# Patient Record
Sex: Female | Born: 1960 | Race: Black or African American | Hispanic: No | Marital: Single | Smoking: Former smoker
Health system: Southern US, Community
[De-identification: ages and names within clinical notes are randomized; demographics above are authoritative.]

## PROBLEM LIST (undated history)

## (undated) DIAGNOSIS — K429 Umbilical hernia without obstruction or gangrene: Secondary | ICD-10-CM

## (undated) DIAGNOSIS — R000 Tachycardia, unspecified: Secondary | ICD-10-CM

## (undated) DIAGNOSIS — M1990 Unspecified osteoarthritis, unspecified site: Secondary | ICD-10-CM

## (undated) DIAGNOSIS — K219 Gastro-esophageal reflux disease without esophagitis: Secondary | ICD-10-CM

## (undated) DIAGNOSIS — I10 Essential (primary) hypertension: Secondary | ICD-10-CM

## (undated) DIAGNOSIS — F419 Anxiety disorder, unspecified: Secondary | ICD-10-CM

## (undated) DIAGNOSIS — K5090 Crohn's disease, unspecified, without complications: Secondary | ICD-10-CM

## (undated) HISTORY — PX: BACK SURGERY: SHX140

## (undated) HISTORY — PX: COLONOSCOPY: SHX174

---

## 1992-10-31 DIAGNOSIS — M1990 Unspecified osteoarthritis, unspecified site: Secondary | ICD-10-CM

## 1992-10-31 HISTORY — DX: Unspecified osteoarthritis, unspecified site: M19.90

## 2011-05-31 DIAGNOSIS — I10 Essential (primary) hypertension: Secondary | ICD-10-CM | POA: Insufficient documentation

## 2011-11-01 HISTORY — PX: UPPER GI ENDOSCOPY: SHX6162

## 2011-11-29 ENCOUNTER — Emergency Department: Payer: Self-pay | Admitting: Emergency Medicine

## 2011-11-29 LAB — URINALYSIS, COMPLETE
Bacteria: NONE SEEN
Bilirubin,UR: NEGATIVE
Blood: NEGATIVE
Glucose,UR: NEGATIVE mg/dL (ref 0–75)
Ketone: NEGATIVE
Leukocyte Esterase: NEGATIVE
Nitrite: NEGATIVE
Ph: 6 (ref 4.5–8.0)
RBC,UR: NONE SEEN /HPF (ref 0–5)
Specific Gravity: 1.003 (ref 1.003–1.030)
Squamous Epithelial: NONE SEEN
WBC UR: <1 /HPF (ref 0–5)

## 2011-11-29 LAB — COMPREHENSIVE METABOLIC PANEL
Albumin: 3.6 g/dL (ref 3.4–5.0)
Anion Gap: 10 (ref 7–16)
BUN: 11 mg/dL (ref 7–18)
Calcium, Total: 9.1 mg/dL (ref 8.5–10.1)
Chloride: 99 mmol/L (ref 98–107)
EGFR (African American): >60
EGFR (Non-African Amer.): >60
Glucose: 101 mg/dL — ABNORMAL HIGH (ref 65–99)
Potassium: 3.1 mmol/L — ABNORMAL LOW (ref 3.5–5.1)
SGOT(AST): 23 U/L (ref 15–37)
SGPT (ALT): 20 U/L
Total Protein: 8.6 g/dL — ABNORMAL HIGH (ref 6.4–8.2)

## 2011-11-30 LAB — CBC
HCT: 37.4 % (ref 35.0–47.0)
HGB: 12.3 g/dL (ref 12.0–16.0)
MCH: 27.4 pg (ref 26.0–34.0)
MCV: 84 fL (ref 80–100)
Platelet: 229 10*3/uL (ref 150–440)
RBC: 4.48 10*6/uL (ref 3.80–5.20)
WBC: 7.5 10*3/uL (ref 3.6–11.0)

## 2012-06-05 ENCOUNTER — Other Ambulatory Visit: Payer: Self-pay | Admitting: Gastroenterology

## 2012-06-05 DIAGNOSIS — R109 Unspecified abdominal pain: Secondary | ICD-10-CM

## 2012-06-07 ENCOUNTER — Ambulatory Visit
Admission: RE | Admit: 2012-06-07 | Discharge: 2012-06-07 | Disposition: A | Discharge status: Home / Self Care | Payer: Medicare Other | Source: Ambulatory Visit | Attending: Gastroenterology | Admitting: Gastroenterology

## 2012-06-07 DIAGNOSIS — R109 Unspecified abdominal pain: Secondary | ICD-10-CM

## 2012-08-13 ENCOUNTER — Encounter (HOSPITAL_COMMUNITY): Payer: Self-pay | Admitting: *Deleted

## 2012-08-15 ENCOUNTER — Encounter (HOSPITAL_COMMUNITY): Payer: Self-pay | Admitting: Anesthesiology

## 2012-08-15 ENCOUNTER — Encounter (HOSPITAL_COMMUNITY): Payer: Self-pay | Admitting: *Deleted

## 2012-08-15 ENCOUNTER — Ambulatory Visit (HOSPITAL_COMMUNITY)
Admission: RE | Admit: 2012-08-15 | Discharge: 2012-08-15 | Disposition: A | Discharge status: Home / Self Care | Payer: Medicare Other | Source: Ambulatory Visit | Attending: Gastroenterology | Admitting: Gastroenterology

## 2012-08-15 ENCOUNTER — Encounter (HOSPITAL_COMMUNITY)
Admission: RE | Disposition: A | Discharge status: Home / Self Care | Payer: Self-pay | Source: Ambulatory Visit | Attending: Gastroenterology

## 2012-08-15 ENCOUNTER — Ambulatory Visit (HOSPITAL_COMMUNITY): Payer: Medicare Other | Admitting: Anesthesiology

## 2012-08-15 DIAGNOSIS — I10 Essential (primary) hypertension: Secondary | ICD-10-CM | POA: Insufficient documentation

## 2012-08-15 DIAGNOSIS — K5090 Crohn's disease, unspecified, without complications: Secondary | ICD-10-CM | POA: Insufficient documentation

## 2012-08-15 DIAGNOSIS — K2980 Duodenitis without bleeding: Secondary | ICD-10-CM | POA: Insufficient documentation

## 2012-08-15 DIAGNOSIS — K2940 Chronic atrophic gastritis without bleeding: Secondary | ICD-10-CM | POA: Insufficient documentation

## 2012-08-15 DIAGNOSIS — Z79899 Other long term (current) drug therapy: Secondary | ICD-10-CM | POA: Insufficient documentation

## 2012-08-15 DIAGNOSIS — R1011 Right upper quadrant pain: Secondary | ICD-10-CM | POA: Insufficient documentation

## 2012-08-15 DIAGNOSIS — K219 Gastro-esophageal reflux disease without esophagitis: Secondary | ICD-10-CM | POA: Insufficient documentation

## 2012-08-15 HISTORY — DX: Unspecified osteoarthritis, unspecified site: M19.90

## 2012-08-15 HISTORY — DX: Crohn's disease, unspecified, without complications: K50.90

## 2012-08-15 HISTORY — DX: Umbilical hernia without obstruction or gangrene: K42.9

## 2012-08-15 HISTORY — DX: Gastro-esophageal reflux disease without esophagitis: K21.9

## 2012-08-15 HISTORY — DX: Essential (primary) hypertension: I10

## 2012-08-15 HISTORY — DX: Tachycardia, unspecified: R00.0

## 2012-08-15 HISTORY — DX: Anxiety disorder, unspecified: F41.9

## 2012-08-15 SURGERY — ESOPHAGOGASTRODUODENOSCOPY (EGD) WITH PROPOFOL
Anesthesia: Monitor Anesthesia Care

## 2012-08-15 MED ORDER — SODIUM CHLORIDE 0.9 % IV SOLN
INTRAVENOUS | Status: DC
  Administered 2012-08-15: 12:00:00 via INTRAVENOUS

## 2012-08-15 MED ORDER — MIDAZOLAM HCL 5 MG/5ML IJ SOLN
INTRAMUSCULAR | Status: DC | PRN
  Administered 2012-08-15 (×2): 1 mg via INTRAVENOUS

## 2012-08-15 MED ORDER — BUTAMBEN-TETRACAINE-BENZOCAINE 2-2-14 % EX AERO
INHALATION_SPRAY | CUTANEOUS | Status: DC | PRN
  Administered 2012-08-15: 2 via TOPICAL

## 2012-08-15 MED ORDER — FENTANYL CITRATE 0.05 MG/ML IJ SOLN
INTRAMUSCULAR | Status: DC | PRN
  Administered 2012-08-15 (×2): 50 ug via INTRAVENOUS

## 2012-08-15 MED ORDER — PROPOFOL 10 MG/ML IV EMUL
INTRAVENOUS | Status: DC | PRN
  Administered 2012-08-15: 125 ug/kg/min via INTRAVENOUS

## 2012-08-15 SURGICAL SUPPLY — 14 items

## 2012-08-15 NOTE — Preoperative (Addendum)
Beta Blockers   Reason not to administer Beta Blockers:Not Applicable 

## 2012-08-15 NOTE — Anesthesia Preprocedure Evaluation (Addendum)
Anesthesia Evaluation  Patient identified by MRN, date of birth, ID band Patient awake    Reviewed: Allergy & Precautions, H&P , NPO status , Patient's Chart, lab work & pertinent test results  Airway Mallampati: II TM Distance: >3 FB Neck ROM: Full    Dental  (+) Teeth Intact and Dental Advisory Given   Pulmonary neg pulmonary ROS,  breath sounds clear to auscultation  Pulmonary exam normal       Cardiovascular hypertension, Pt. on medications and Pt. on home beta blockers Rhythm:Regular Rate:Normal     Neuro/Psych Anxiety negative neurological ROS     GI/Hepatic Neg liver ROS, GERD-  Medicated,Crohns dz   Endo/Other  negative endocrine ROS  Renal/GU negative Renal ROS  negative genitourinary   Musculoskeletal negative musculoskeletal ROS (+)   Abdominal   Peds  Hematology negative hematology ROS (+)   Anesthesia Other Findings   Reproductive/Obstetrics negative OB ROS                          Anesthesia Physical Anesthesia Plan  ASA: II  Anesthesia Plan: MAC   Post-op Pain Management:    Induction: Intravenous  Airway Management Planned: Nasal Cannula  Additional Equipment:   Intra-op Plan:   Post-operative Plan:   Informed Consent: I have reviewed the patients History and Physical, chart, labs and discussed the procedure including the risks, benefits and alternatives for the proposed anesthesia with the patient or authorized representative who has indicated his/her understanding and acceptance.   Dental advisory given  Plan Discussed with: CRNA  Anesthesia Plan Comments:         Anesthesia Quick Evaluation

## 2012-08-15 NOTE — Op Note (Signed)
[address] [facility] [address] [place], [address]   ENDOSCOPY PROCEDURE REPORT  PATIENT: Denise, Schaefer  MR#: [id number] BIRTHDATE: 1961/03/31 , 51  yrs. old GENDER: Female ENDOSCOPIST: Willis Modena, MD REFERRED BY:  Stephannie Peters. Kizzie Bane, MD PROCEDURE DATE:  08/15/2012 PROCEDURE:  EGD w/ biopsy ASA CLASS:     Class II INDICATIONS:  abdominal pain in the upper right quadrant.   Crohn's disease. MEDICATIONS: MAC sedation, administered by CRNA TOPICAL ANESTHETIC: Cetacaine Spray  DESCRIPTION OF PROCEDURE: After the risks benefits and alternatives of the procedure were thoroughly explained, informed consent was obtained.  The Pentax Gastroscope [id number] endoscope was introduced through the mouth and advanced to the second portion of the duodenum. Without limitations.  The instrument was slowly withdrawn as the mucosa was fully examined.     Findings:  Normal esophagus.  Antrum and body of stomach were a bit edematous and had some superficial erosions, biopsied with cold forceps.  Stomach otherwise normal.  Duodenal bulb and c-loop had some erosions and fibrotic-appearing tissue, biopsied with cold forceps.  Duodenum otherwise normal to the second portion. Retroflexion into cardia was normal.          The scope was then withdrawn from the patient and the procedure completed.  ENDOSCOPIC IMPRESSION:     As above.  Unclear if above findings in duodenum and stomach are peptic in nature or related to her Crohn's disease.  RECOMMENDATIONS:     1.  Watch for potential complications of procedure. 2.  Await biopsy results. 3.  Continue Nexium 40 mg bo bid. 4.  If biopsies are unrevealing, consider HIDA as next step in evaluation of her RUQ pain. 5.  Follow-up with [facility] in 6-8 weeks.  eSigned:  Willis Modena, MD 08/15/2012 12:17 PM   CC:

## 2012-08-15 NOTE — H&P (Signed)
Patient interval history reviewed.  Patient examined again.  There has been no change from documented H/P dated 08/09/12 (scanned into chart from our office) except as documented above.  Assessment:  1.  Crohn's disease. 2.  Right upper quadrant abdominal pain.  Plan:  1.  Endoscopy. 2.  Risks (bleeding, infection, bowel perforation that could require surgery, sedation-related changes in cardiopulmonary systems), benefits (identification and possible treatment of source of symptoms, exclusion of certain causes of symptoms), and alternatives (watchful waiting, radiographic imaging studies, empiric medical treatment) of upper endoscopy (EGD) were explained to patient in detail and patient wishes to proceed.

## 2012-08-15 NOTE — Transfer of Care (Signed)
Immediate Anesthesia Transfer of Care Note  Patient: Denise Schaefer  Procedure(s) Performed: Procedure(s) (LRB) with comments: ESOPHAGOGASTRODUODENOSCOPY (EGD) WITH PROPOFOL (N/A)  Patient Location: PACU and Endoscopy Unit  Anesthesia Type: MAC  Level of Consciousness: awake, alert , oriented and patient cooperative  Airway & Oxygen Therapy: Patient Spontanous Breathing and Patient connected to nasal cannula oxygen  Post-op Assessment: Report given to PACU RN, Post -op Vital signs reviewed and stable and Patient moving all extremities  Post vital signs: Reviewed and stable  Complications: No apparent anesthesia complications

## 2012-08-15 NOTE — Anesthesia Postprocedure Evaluation (Signed)
Anesthesia Post Note  Patient: Denise Schaefer  Procedure(s) Performed: Procedure(s) (LRB): ESOPHAGOGASTRODUODENOSCOPY (EGD) WITH PROPOFOL (N/A)  Anesthesia type: MAC  Patient location: PACU  Post pain: Pain level controlled  Post assessment: Post-op Vital signs reviewed  Last Vitals:  Filed Vitals:   08/15/12 1220  BP: 95/65  Pulse: 64  Temp: 36.6 C  Resp: 19    Post vital signs: Reviewed  Level of consciousness: sedated  Complications: No apparent anesthesia complications

## 2012-09-17 ENCOUNTER — Other Ambulatory Visit (HOSPITAL_COMMUNITY): Payer: Self-pay | Admitting: Gastroenterology

## 2012-09-17 DIAGNOSIS — R1011 Right upper quadrant pain: Secondary | ICD-10-CM

## 2012-09-25 ENCOUNTER — Encounter (HOSPITAL_COMMUNITY): Payer: Medicare Other

## 2012-10-04 ENCOUNTER — Encounter (HOSPITAL_COMMUNITY)
Admission: RE | Admit: 2012-10-04 | Discharge: 2012-10-04 | Disposition: A | Discharge status: Home / Self Care | Payer: Medicare Other | Source: Ambulatory Visit | Attending: Gastroenterology | Admitting: Gastroenterology

## 2012-10-04 DIAGNOSIS — R1011 Right upper quadrant pain: Secondary | ICD-10-CM | POA: Insufficient documentation

## 2012-10-04 MED ORDER — SINCALIDE 5 MCG IJ SOLR
INTRAMUSCULAR | Status: AC
  Administered 2012-10-04: 1.68 ug via INTRAVENOUS
  Filled 2012-10-04: qty 5

## 2012-10-04 MED ORDER — SINCALIDE 5 MCG IJ SOLR
0.0200 ug/kg | Freq: Once | INTRAMUSCULAR | Status: AC
  Administered 2012-10-04: 1.68 ug via INTRAVENOUS

## 2012-10-04 MED ORDER — TECHNETIUM TC 99M MEBROFENIN IV KIT
5.0000 | PACK | Freq: Once | INTRAVENOUS | Status: AC | PRN
  Administered 2012-10-04: 5 via INTRAVENOUS

## 2012-12-19 ENCOUNTER — Ambulatory Visit: Payer: Self-pay | Admitting: Anesthesiology

## 2012-12-19 LAB — CBC WITH DIFFERENTIAL/PLATELET
Basophil #: 0 10*3/uL (ref 0.0–0.1)
Eosinophil #: 0.1 10*3/uL (ref 0.0–0.7)
HCT: 34.4 % — ABNORMAL LOW (ref 35.0–47.0)
HGB: 11.1 g/dL — ABNORMAL LOW (ref 12.0–16.0)
Lymphocyte #: 1.9 10*3/uL (ref 1.0–3.6)
MCH: 25.6 pg — ABNORMAL LOW (ref 26.0–34.0)
MCHC: 32.1 g/dL (ref 32.0–36.0)
Monocyte %: 10.4 %
Neutrophil #: 2.5 10*3/uL (ref 1.4–6.5)
Platelet: 278 10*3/uL (ref 150–440)
RDW: 15.3 % — ABNORMAL HIGH (ref 11.5–14.5)
WBC: 5 10*3/uL (ref 3.6–11.0)

## 2012-12-19 LAB — POTASSIUM: Potassium: 3.4 mmol/L — ABNORMAL LOW (ref 3.5–5.1)

## 2012-12-19 LAB — SEDIMENTATION RATE: Erythrocyte Sed Rate: 43 mm/hr — ABNORMAL HIGH (ref 0–30)

## 2012-12-27 ENCOUNTER — Ambulatory Visit: Payer: Self-pay | Admitting: General Surgery

## 2013-03-14 ENCOUNTER — Other Ambulatory Visit: Payer: Self-pay | Admitting: Gastroenterology

## 2013-03-14 DIAGNOSIS — R197 Diarrhea, unspecified: Secondary | ICD-10-CM

## 2013-03-14 DIAGNOSIS — R109 Unspecified abdominal pain: Secondary | ICD-10-CM

## 2013-03-15 ENCOUNTER — Ambulatory Visit
Admission: RE | Admit: 2013-03-15 | Discharge: 2013-03-15 | Disposition: A | Discharge status: Home / Self Care | Payer: Medicare Other | Source: Ambulatory Visit | Attending: Gastroenterology | Admitting: Gastroenterology

## 2013-03-15 DIAGNOSIS — R197 Diarrhea, unspecified: Secondary | ICD-10-CM

## 2013-03-15 DIAGNOSIS — R109 Unspecified abdominal pain: Secondary | ICD-10-CM

## 2013-03-15 MED ORDER — IOHEXOL 300 MG/ML  SOLN
100.0000 mL | Freq: Once | INTRAMUSCULAR | Status: AC | PRN
  Administered 2013-03-15: 100 mL via INTRAVENOUS

## 2013-03-20 ENCOUNTER — Ambulatory Visit (INDEPENDENT_AMBULATORY_CARE_PROVIDER_SITE_OTHER): Payer: Medicare Other | Admitting: General Surgery

## 2013-03-20 ENCOUNTER — Encounter: Payer: Self-pay | Admitting: General Surgery

## 2013-03-20 VITALS — BP 128/74 | HR 72 | Resp 14 | Ht 69.0 in | Wt 193.0 lb

## 2013-03-20 DIAGNOSIS — K429 Umbilical hernia without obstruction or gangrene: Secondary | ICD-10-CM

## 2013-03-20 HISTORY — PX: UMBILICAL HERNIA REPAIR: SHX196

## 2013-03-20 NOTE — Patient Instructions (Signed)
Patient to return as needed. 

## 2013-03-20 NOTE — Progress Notes (Signed)
Patient ID: Denise Schaefer, female   DOB: 07/07/1961, 52 y.o.   MRN: [id number]  Chief Complaint  Patient presents with  . Follow-up    complaint of knot around umbilical area. Has had hernia surgery on 12/27/12    HPI Denise Schaefer is a 52 y.o. female who presents with a complaint of a knot around her umbilical area. She noticed the knot approximately 1 month ago. She had an umbilical hernia repair done on 12/27/12. She denies any pain associated with the area. She states she has had increasing problems with her crohn's in the last month. She has tried to reframe from straining due to her recent Crohn's issue.  The patient underwent diagnostic laparoscopy and repair of a 1 cm fascial defect at the umbilicus (primary repair) on 12/27/2012. She did well, and at the time of her 01/02/2013 exam was asymptomatic. She was concerned that she felt an area of swelling above the level of the hernia repair. HPI  Past Medical History  Diagnosis Date  . Anxiety   . GERD (gastroesophageal reflux disease)   . Umbilical hernia   . Crohn's disease   . Tachycardia, unspecified     after cervical fusion  . 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 Hypertension age 60    Past Surgical History  Procedure Laterality Date  . Back surgery      cervical fusion  . Colonoscopy    . Upper gi endoscopy  2013    History reviewed. No pertinent family history.  Social History History  Substance Use Topics  . Smoking status: Former Smoker -- 1.50 packs/day for 15 years    Types: Cigarettes    Quit date: 12/01/2011  . Smokeless tobacco: Never Used  . Alcohol Use: No    Allergies  Allergen Reactions  . Penicillins Rash  . Sulfa Antibiotics Hives    Current Outpatient Prescriptions  Medication Sig Dispense Refill  . Biotin 5000 MCG CAPS Take 1 capsule by mouth daily.      . cholecalciferol (VITAMIN D) 1000 UNITS tablet Take 1,000 Units by mouth daily.      Marland Kitchen esomeprazole (NEXIUM) 40 MG capsule Take 40 mg by mouth  2 (two) times daily.      . fluticasone (FLONASE) 50 MCG/ACT nasal spray Place 1 spray into the nose 2 (two) times daily.      . hydrochlorothiazide (HYDRODIURIL) 25 MG tablet Take 25 mg by mouth daily.      . hydrocortisone (ANUSOL-HC) 2.5 % rectal cream Place 1 application rectally 2 (two) times daily.      . mesalamine (PENTASA) 500 MG CR capsule Take 500 mg by mouth 4 (four) times daily.      . potassium chloride SA (K-DUR,KLOR-CON) 20 MEQ tablet Take 1 tablet by mouth daily.      . sucralfate (CARAFATE) 1 G tablet Take 1 tablet by mouth 4 (four) times daily.       No current facility-administered medications for this visit.    Review of Systems Review of Systems  Constitutional: Negative.   Respiratory: Negative.   Cardiovascular: Negative.   Gastrointestinal: Negative.     Blood pressure 128/74, pulse 72, resp. rate 14, height 5\' 9"  (1.753 m), weight 193 lb (87.544 kg).  Physical Exam Physical Exam  Constitutional: She appears well-developed and well-nourished.  Neck: Trachea normal. No mass and no thyromegaly present.  Cardiovascular: Normal rate, regular rhythm, normal heart sounds and normal pulses.   No murmur heard.  Pulmonary/Chest: Effort normal and breath sounds normal.  Abdominal: Soft. Normal appearance and bowel sounds are normal. There is no hepatosplenomegaly. There is no tenderness. No hernia.   There is no evidence of a recurrent hernia. No thickening, skin distortion or edema appreciated. Data Reviewed CT of the abdomen and pelvis dated 03/14/2013 showed increased changes in the terminal ileal area consistent with the patient's known Crohn's disease. No evidence of recurrent umbilical hernia.  Assessment    Crohn's flare, improved. Normal postop umbilical hernia exam.    Plan    The patient will continue followup with her gastroenterologist. She was reassured that no evidence exists for a recurrent hernia.       Earline Mayotte 03/20/2013, 1:55  PM

## 2013-04-13 ENCOUNTER — Emergency Department: Payer: Self-pay | Admitting: Emergency Medicine

## 2013-04-13 LAB — CBC
HGB: 11.8 g/dL — ABNORMAL LOW (ref 12.0–16.0)
MCHC: 33.3 g/dL (ref 32.0–36.0)
Platelet: 305 10*3/uL (ref 150–440)
RDW: 16.5 % — ABNORMAL HIGH (ref 11.5–14.5)
WBC: 7.6 10*3/uL (ref 3.6–11.0)

## 2013-04-13 LAB — URINALYSIS, COMPLETE
Glucose,UR: NEGATIVE mg/dL (ref 0–75)
Hyaline Cast: 1
Nitrite: NEGATIVE
Protein: NEGATIVE
RBC,UR: 2 /HPF (ref 0–5)
Specific Gravity: 1.028 (ref 1.003–1.030)
Squamous Epithelial: 8
WBC UR: 2 /HPF (ref 0–5)

## 2013-06-03 HISTORY — PX: CARPAL TUNNEL RELEASE: SHX101

## 2014-01-29 ENCOUNTER — Emergency Department: Payer: Self-pay | Admitting: Emergency Medicine

## 2014-01-29 ENCOUNTER — Other Ambulatory Visit: Payer: Self-pay | Admitting: General Surgery

## 2014-01-29 LAB — URINALYSIS, COMPLETE
BLOOD: NEGATIVE
Bacteria: NONE SEEN
Bilirubin,UR: NEGATIVE
Glucose,UR: NEGATIVE mg/dL (ref 0–75)
Ketone: NEGATIVE
Leukocyte Esterase: NEGATIVE
Nitrite: NEGATIVE
Ph: 5 (ref 4.5–8.0)
Protein: NEGATIVE
RBC,UR: 1 /HPF (ref 0–5)
SPECIFIC GRAVITY: 1.016 (ref 1.003–1.030)
Squamous Epithelial: 1

## 2014-01-29 LAB — CBC WITH DIFFERENTIAL/PLATELET
BASOS ABS: 0.1 10*3/uL (ref 0.0–0.1)
Basophil %: 1 %
EOS ABS: 0.1 10*3/uL (ref 0.0–0.7)
Eosinophil %: 1.1 %
HCT: 36.7 % (ref 35.0–47.0)
HGB: 11.6 g/dL — ABNORMAL LOW (ref 12.0–16.0)
LYMPHS ABS: 2.5 10*3/uL (ref 1.0–3.6)
Lymphocyte %: 45.3 %
MCH: 25.9 pg — ABNORMAL LOW (ref 26.0–34.0)
MCHC: 31.7 g/dL — AB (ref 32.0–36.0)
MCV: 82 fL (ref 80–100)
MONO ABS: 0.6 x10 3/mm (ref 0.2–0.9)
MONOS PCT: 11.4 %
NEUTROS ABS: 2.3 10*3/uL (ref 1.4–6.5)
Neutrophil %: 41.2 %
Platelet: 228 10*3/uL (ref 150–440)
RBC: 4.48 10*6/uL (ref 3.80–5.20)
RDW: 15.1 % — ABNORMAL HIGH (ref 11.5–14.5)
WBC: 5.6 10*3/uL (ref 3.6–11.0)

## 2014-01-29 LAB — COMPREHENSIVE METABOLIC PANEL
ALK PHOS: 121 U/L — AB
Albumin: 3.3 g/dL — ABNORMAL LOW (ref 3.4–5.0)
Anion Gap: 4 — ABNORMAL LOW (ref 7–16)
BUN: 11 mg/dL (ref 7–18)
Bilirubin,Total: 0.3 mg/dL (ref 0.2–1.0)
CO2: 31 mmol/L (ref 21–32)
Calcium, Total: 8.7 mg/dL (ref 8.5–10.1)
Chloride: 102 mmol/L (ref 98–107)
Creatinine: 0.87 mg/dL (ref 0.60–1.30)
EGFR (Non-African Amer.): >60
Glucose: 90 mg/dL (ref 65–99)
Osmolality: 273 (ref 275–301)
POTASSIUM: 3.2 mmol/L — AB (ref 3.5–5.1)
SGOT(AST): 25 U/L (ref 15–37)
SGPT (ALT): 21 U/L (ref 12–78)
SODIUM: 137 mmol/L (ref 136–145)
TOTAL PROTEIN: 8.2 g/dL (ref 6.4–8.2)

## 2014-01-29 LAB — TROPONIN I: Troponin-I: <0.02 ng/mL

## 2014-01-29 LAB — LIPASE, BLOOD: LIPASE: 160 U/L (ref 73–393)

## 2014-04-21 ENCOUNTER — Ambulatory Visit: Payer: Self-pay | Admitting: Hematology and Oncology

## 2014-04-30 ENCOUNTER — Ambulatory Visit: Payer: Self-pay | Admitting: Hematology and Oncology

## 2014-09-01 ENCOUNTER — Encounter: Payer: Self-pay | Admitting: General Surgery

## 2014-09-23 ENCOUNTER — Encounter: Payer: Self-pay | Admitting: Gastroenterology

## 2014-10-20 ENCOUNTER — Ambulatory Visit: Payer: Self-pay | Admitting: Urgent Care

## 2014-10-20 LAB — URINALYSIS, COMPLETE
BILIRUBIN, UR: NEGATIVE
BLOOD: NEGATIVE
GLUCOSE, UR: NEGATIVE mg/dL (ref 0–75)
Ketone: NEGATIVE
Nitrite: NEGATIVE
Ph: 6 (ref 4.5–8.0)
Protein: NEGATIVE
RBC,UR: 1 /HPF (ref 0–5)
SPECIFIC GRAVITY: 1.012 (ref 1.003–1.030)

## 2014-10-20 LAB — CBC WITH DIFFERENTIAL/PLATELET
Basophil #: 0.1 10*3/uL (ref 0.0–0.1)
Basophil %: 0.9 %
EOS ABS: 0.1 10*3/uL (ref 0.0–0.7)
EOS PCT: 1 %
HCT: 37.9 % (ref 35.0–47.0)
HGB: 12.1 g/dL (ref 12.0–16.0)
Lymphocyte #: 2.7 10*3/uL (ref 1.0–3.6)
Lymphocyte %: 35.9 %
MCH: 27.5 pg (ref 26.0–34.0)
MCHC: 31.8 g/dL — ABNORMAL LOW (ref 32.0–36.0)
MCV: 87 fL (ref 80–100)
Monocyte #: 0.7 x10 3/mm (ref 0.2–0.9)
Monocyte %: 9.6 %
NEUTROS ABS: 3.9 10*3/uL (ref 1.4–6.5)
Neutrophil %: 52.6 %
Platelet: 256 10*3/uL (ref 150–440)
RBC: 4.38 10*6/uL (ref 3.80–5.20)
RDW: 13.7 % (ref 11.5–14.5)
WBC: 7.4 10*3/uL (ref 3.6–11.0)

## 2014-10-20 LAB — COMPREHENSIVE METABOLIC PANEL
ALBUMIN: 3.3 g/dL — AB (ref 3.4–5.0)
ALK PHOS: 109 U/L
Anion Gap: 7 (ref 7–16)
BILIRUBIN TOTAL: 0.3 mg/dL (ref 0.2–1.0)
BUN: 9 mg/dL (ref 7–18)
CHLORIDE: 103 mmol/L (ref 98–107)
Calcium, Total: 9 mg/dL (ref 8.5–10.1)
Co2: 31 mmol/L (ref 21–32)
Creatinine: 0.78 mg/dL (ref 0.60–1.30)
EGFR (African American): >60
EGFR (Non-African Amer.): >60
Glucose: 94 mg/dL (ref 65–99)
Osmolality: 280 (ref 275–301)
Potassium: 2.9 mmol/L — ABNORMAL LOW (ref 3.5–5.1)
SGOT(AST): 29 U/L (ref 15–37)
SGPT (ALT): 23 U/L
Sodium: 141 mmol/L (ref 136–145)
Total Protein: 8.3 g/dL — ABNORMAL HIGH (ref 6.4–8.2)

## 2014-10-20 LAB — LIPASE, BLOOD: LIPASE: 123 U/L (ref 73–393)

## 2014-10-27 ENCOUNTER — Ambulatory Visit: Payer: Self-pay | Admitting: Urgent Care

## 2014-10-27 LAB — COMPREHENSIVE METABOLIC PANEL
ALK PHOS: 107 U/L
Albumin: 3.2 g/dL — ABNORMAL LOW (ref 3.4–5.0)
Anion Gap: 6 — ABNORMAL LOW (ref 7–16)
BILIRUBIN TOTAL: 0.3 mg/dL (ref 0.2–1.0)
BUN: 8 mg/dL (ref 7–18)
CALCIUM: 8.9 mg/dL (ref 8.5–10.1)
CREATININE: 0.88 mg/dL (ref 0.60–1.30)
Chloride: 104 mmol/L (ref 98–107)
Co2: 31 mmol/L (ref 21–32)
EGFR (Non-African Amer.): >60
Glucose: 84 mg/dL (ref 65–99)
Osmolality: 279 (ref 275–301)
Potassium: 3.2 mmol/L — ABNORMAL LOW (ref 3.5–5.1)
SGOT(AST): 21 U/L (ref 15–37)
SGPT (ALT): 21 U/L
SODIUM: 141 mmol/L (ref 136–145)
Total Protein: 8 g/dL (ref 6.4–8.2)

## 2014-10-29 ENCOUNTER — Ambulatory Visit: Payer: Medicare Other | Admitting: Gastroenterology

## 2014-11-06 ENCOUNTER — Other Ambulatory Visit: Payer: Self-pay | Admitting: Gastroenterology

## 2014-11-06 ENCOUNTER — Ambulatory Visit (INDEPENDENT_AMBULATORY_CARE_PROVIDER_SITE_OTHER): Payer: Medicare Other | Admitting: Gastroenterology

## 2014-11-06 ENCOUNTER — Encounter: Payer: Self-pay | Admitting: Gastroenterology

## 2014-11-06 ENCOUNTER — Other Ambulatory Visit: Payer: Self-pay

## 2014-11-06 VITALS — BP 127/92 | HR 76 | Temp 97.8°F | Ht 67.0 in | Wt 199.4 lb

## 2014-11-06 DIAGNOSIS — K50812 Crohn's disease of both small and large intestine with intestinal obstruction: Secondary | ICD-10-CM

## 2014-11-06 DIAGNOSIS — K50919 Crohn's disease, unspecified, with unspecified complications: Secondary | ICD-10-CM

## 2014-11-06 NOTE — Progress Notes (Signed)
ON RECALL LIST  °

## 2014-11-06 NOTE — Progress Notes (Signed)
Subjective:    Patient ID: Denise Schaefer, female    DOB: 11/27/1960, 54 y.o.   MRN: [id number]  Freddy Jaksch, MD  HPI Had Crohn's disease SINCE 1998. COURSE COMPLICATED BY MEDICATION SIDE EFFECTS(REMICADE: HEPATITIS). DID NOT TOLERATE HUMIRA. BRUISING. ON PENTASA (WORKED FOR A WHILE) AND ASACOL(DIDN'T WORK). DOESN'T RECALL 6-MP/AZATHIOPURINE. LAST HUMIRA/REMICADE ( JUL 2015). TRIED ENTOCORT.   BMs: 5-6 (SOFT BLOBS) IN THE AM(NO BLOOD FOR 2 WEEKS WHEN SHE WIPED). SUBJECTIVE CHILLS/FEVER. FEELS WIPED OUT. MAY HAVE A CATCH IN RUQ(BURNING). TCS EVERY YEAR: 2015. MAY HAVE BURNING IN CHEST A.D MID/UPR ABDOMEN. EGD 2 YRS AGO: OFF AN ON SX FOR 2 YRS. NO WEIGHT LOSS-QUIT SMOKING Nov 30, 2010. NO ASPIRIN, BC/GOODY POWDERS, IBUPROFEN/MOTRIN, OR NAPROXEN/ALEVE. NO ETOH. HIP AND JOINT PROBLEMS.    PT DENIES FEVER, CHILLS, nausea, vomiting, melena, diarrhea, CHEST PAIN, SHORTNESS OF BREATH,  constipation, problems swallowing, problems with sedation, OR  heartburn or indigestion. NO SORES IN MOUTH, EYE PAIN,  OR RASH ON LEGS. WATERY EYES.    Past Medical History  Diagnosis Date  . Anxiety   . GERD (gastroesophageal reflux disease)   . Umbilical hernia   . Crohn's disease   . Tachycardia, unspecified     after cervical fusion  . 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 Hypertension age 76   Past Surgical History  Procedure Laterality Date  . Back surgery      cervical fusion  . Colonoscopy    . Upper gi endoscopy  2013   Allergies  Allergen Reactions  . Penicillins Rash  . Sulfa Antibiotics Hives  . Oxycodone Hives  . Tramadol Hives    Itching and hives     Current Outpatient Prescriptions  Medication Sig Dispense Refill  . Biotin 5000 MCG CAPS Take 1 capsule by mouth daily.    . busPIRone (BUSPAR) 7.5 MG tablet Take 7.5 mg by mouth 2 (two) times daily.    . cetirizine (ZYRTEC) 10 MG tablet Take 10 mg by mouth daily.    . cholecalciferol (VITAMIN D) 1000 UNITS tablet Take 1,000 Units by  mouth daily.    Marland Kitchen esomeprazole (NEXIUM) 40 MG capsule Take 40 mg by mouth 2 (two) times daily.    . ferrous sulfate 325 (65 FE) MG tablet Take 325 mg by mouth daily with breakfast.    . fluticasone (FLONASE) 50 MCG/ACT nasal spray Place 1 spray into the nose 2 (two) times daily.    . hydrochlorothiazide (HYDRODIURIL) 25 MG tablet Take 25 mg by mouth daily.    . hydrocortisone (ANUSOL-HC) 2.5 % rectal cream Place 1 application rectally 2 (two) times daily.    . pantoprazole (PROTONIX) 40 MG tablet Take 40 mg by mouth 2 (two) times daily.    . mesalamine (PENTASA) 500 MG CR capsule Take 500 mg by mouth 4 (four) times daily.    . potassium chloride SA (K-DUR,KLOR-CON) 20 MEQ tablet Take 1 tablet by mouth daily.    . sucralfate (CARAFATE) 1 G tablet Take 1 tablet by mouth 4 (four) times daily.     Family History  Problem Relation Age of Onset  . Colon cancer Neg Hx   . Colon polyps Neg Hx   . Crohn's disease      2ND/3RD COUSINS    History  Substance Use Topics  . Smoking status: Former Smoker -- 1.50 packs/day for 15 years    Types: Cigarettes    Quit date: 12/01/2011  . Smokeless tobacco: Never Used  .  Alcohol Use: No      Review of Systems PER HPI OTHERWISE ALL SYSTEMS ARE NEGATIVE.    Objective:   Physical Exam  Constitutional: She is oriented to person, place, and time. She appears well-developed and well-nourished. No distress.  HENT:  Head: Normocephalic and atraumatic.  Mouth/Throat: Oropharynx is clear and moist. No oropharyngeal exudate.  Eyes: Pupils are equal, round, and reactive to light. No scleral icterus.  Neck: Normal range of motion. Neck supple.  Cardiovascular: Normal rate, regular rhythm and normal heart sounds.   Pulmonary/Chest: Effort normal and breath sounds normal. No respiratory distress.  Abdominal: Soft. Bowel sounds are normal. She exhibits no distension. There is tenderness. There is no rebound and no guarding.  MILD LUQ TTP   Musculoskeletal:  She exhibits no edema.  Lymphadenopathy:    She has no cervical adenopathy.  Neurological: She is alert and oriented to person, place, and time.  NO FOCAL DEFICITS   Psychiatric:  FLAT AFFECT, NL MOOD   Vitals reviewed.         Assessment & Plan:

## 2014-11-06 NOTE — Assessment & Plan Note (Signed)
SX NOT IDEALLY CONTROLLED ON PENTASA.  PT DOES NOT DESIRE A BIOLOGIC AGENT AT THIS TIME. CHECK CBC/HFP/TPMT ACTIVITY. CONSIDER 6-MP/IMURAN. FOLLOW UP IN 3 MOS.

## 2014-11-06 NOTE — Patient Instructions (Signed)
PLEASE GET YOUR BLOOD DRAWN TODAY.  YOUR BIOPSY WILL BE BACK IN 14 DAYS BECAUSE ONE IS A SEND OUT TEST TO [place].Marland Kitchen.  FOLLOW UP IN 3 MOS.

## 2014-11-07 LAB — CBC WITH DIFFERENTIAL/PLATELET
Basophils Absolute: 0 10*3/uL (ref 0.0–0.1)
Basophils Relative: 0 % (ref 0–1)
Eosinophils Absolute: 0.1 10*3/uL (ref 0.0–0.7)
Eosinophils Relative: 1 % (ref 0–5)
HCT: 36.7 % (ref 36.0–46.0)
Hemoglobin: 12 g/dL (ref 12.0–15.0)
Lymphocytes Relative: 32 % (ref 12–46)
Lymphs Abs: 1.9 10*3/uL (ref 0.7–4.0)
MCH: 27.1 pg (ref 26.0–34.0)
MCHC: 32.7 g/dL (ref 30.0–36.0)
MCV: 83 fL (ref 78.0–100.0)
MPV: 10.9 fL (ref 8.6–12.4)
Monocytes Absolute: 0.8 10*3/uL (ref 0.1–1.0)
Monocytes Relative: 14 % — ABNORMAL HIGH (ref 3–12)
Neutro Abs: 3.1 10*3/uL (ref 1.7–7.7)
Neutrophils Relative %: 53 % (ref 43–77)
Platelets: 277 10*3/uL (ref 150–400)
RBC: 4.42 MIL/uL (ref 3.87–5.11)
RDW: 13.9 % (ref 11.5–15.5)
WBC: 5.8 10*3/uL (ref 4.0–10.5)

## 2014-11-07 LAB — HEPATIC FUNCTION PANEL
ALT: 12 U/L (ref 0–35)
AST: 20 U/L (ref 0–37)
Albumin: 3.8 g/dL (ref 3.5–5.2)
Alkaline Phosphatase: 96 U/L (ref 39–117)
Bilirubin, Direct: 0.1 mg/dL (ref 0.0–0.3)
Indirect Bilirubin: 0.3 mg/dL (ref 0.2–1.2)
Total Bilirubin: 0.4 mg/dL (ref 0.2–1.2)
Total Protein: 7.7 g/dL (ref 6.0–8.3)

## 2014-11-07 LAB — PROMETHEUS-MAIL

## 2014-12-01 ENCOUNTER — Telehealth: Payer: Self-pay | Admitting: Gastroenterology

## 2014-12-01 DIAGNOSIS — K5080 Crohn's disease of both small and large intestine without complications: Secondary | ICD-10-CM

## 2014-12-01 MED ORDER — AZATHIOPRINE 100 MG PO TABS: ORAL_TABLET | ORAL | Status: DC

## 2014-12-01 NOTE — Telephone Encounter (Signed)
Called patient TO DISCUSS RESULTS. BLOOD COUNT AND LIVER TESTS ARE NORMAL. TPMT ACTIVITY NORMAL. DIDN'T TOLERATE ASACOL IN THE PAST. DISCUSSED SIDE EFFECTS WITH PT(PANCREATITIS, ELEVATED LIVER ENZYMES, NAUSEA, VOMITING, ABDOMINAL PAIN.  [place], [place] PCP IS DR. Mardee PostinKIMBERLY WEAVER [phone]. PT WILL HAVE LABS DRAWN AT WEEK 2, 4 AND 8 AFTER STARTING IMURAN . WILL MAIL LAB SLIPS TO PT. SPOKE TO DR. Wende MottWEAVER'S NURSE. LVM-EXPLAINED WHY PT WOULD NEED TO COME TO HAVE BLOOD DRAWN.  PT NEEDS OPV APR 2016 E30 CROHN'S DISEASE.

## 2014-12-02 NOTE — Telephone Encounter (Signed)
Lab orders mailed to pt and highlighted for which week due.

## 2014-12-02 NOTE — Telephone Encounter (Signed)
ON RECALL LIST FOR APRIL

## 2014-12-17 ENCOUNTER — Encounter: Payer: Self-pay | Admitting: Gastroenterology

## 2015-01-20 ENCOUNTER — Encounter: Payer: Self-pay | Admitting: Gastroenterology

## 2015-02-20 NOTE — Op Note (Signed)
PATIENT NAME:  Denise Schaefer, Denise Schaefer MR#:  [id number] DATE OF BIRTH:  [birth year]  DATE OF PROCEDURE:  12/27/2012  PREOPERATIVE DIAGNOSIS: Right lower quadrant abdominal pain, umbilical hernia.   POSTOPERATIVE DIAGNOSIS: Right lower quadrant abdominal pain, umbilical hernia.   OPERATIVE PROCEDURE: Diagnostic laparoscopy and repair of umbilical hernia.   SURGEON: Donnalee CurryJeffrey Heath Tesler, MD.   ANESTHESIA: General endotracheal under Dr. Henrene HawkingKephart, Marcaine 0.5% plain, 30 mL local infiltration, Toradol 30 mg.   ESTIMATED BLOOD LOSS: Minimal.   CLINICAL NOTE: This 54 year old woman with known regional enteritis was initially seen in May 2013 with right lower quadrant pain and the concern for possible inguinal hernia. None was identified. She did have a minimally symptomatic umbilical hernia noted at that time. She returned at this time for reassessment. Abdominal exam again is unremarkable for an inguinal hernia and her abdominal symptoms have somewhat abated. It was elected to proceed to diagnostic laparoscopy and repair of umbilical hernia to confirm that indeed no inguinal hernia was present.   OPERATIVE NOTE: With the patient under adequate general endotracheal anesthesia, the abdomen was prepped with ChloraPrep and draped. Marcaine was infiltrated for a block around the umbilical area. An infraumbilical incision was made through a previous incision and extended slightly to allow adequate exposure. Skin was incised sharply and the remaining dissection completed with electrocautery and sharp dissection. The hernia sac was identified and transected. This was discarded. The fascial defect was approximately 1 cm in diameter. The undersurface was cleared. A Hassan cannula was placed and inspection with pneumoperitoneum at 10 mmHg pressure completed on the anterior abdominal wall. No abdominal adhesions were noted. No evidence of inguinal hernias were noted. There was suggestion of possible slight thickening of fat in the  right lower quadrant corresponding to a previously completed CT showing focal regional enteritis. No active inflammatory process was noted.   The abdomen was desufflated and the fascial defect at the umbilicus approximated with interrupted 0 Surgilon sutures. The umbilical skin was tacked to the fascia with 3-0 Vicryl. The adipose layer was closed with a running 3-0 Vicryl and the skin closed with a running 4-0 Vicryl subcuticular suture. Benzoin, Steri-Strips, Telfa, and Tegaderm dressing was then applied. The patient tolerated the procedure well and was taken to the recovery room in stable condition.  ____________________________ Earline MayotteJeffrey W. Mirra Basilio, MD jwb:aw D: 12/27/2012 08:42:05 ET T: 12/27/2012 08:59:23 ET JOB#: [id number]  cc: Earline MayotteJeffrey W. Izea Livolsi, MD, <Dictator> Zara ChessLARRY SYKES, PA Sharen HeckJULIA HUGHES, MD AT [facility], [facility] Kendre Sires Brion AlimentW Alastor Kneale MD ELECTRONICALLY SIGNED 12/29/2012 9:18

## 2015-05-26 ENCOUNTER — Encounter: Payer: Self-pay | Admitting: Surgery

## 2015-05-26 ENCOUNTER — Ambulatory Visit (INDEPENDENT_AMBULATORY_CARE_PROVIDER_SITE_OTHER): Payer: Medicare Other | Admitting: Surgery

## 2015-05-26 VITALS — BP 121/88 | HR 69 | Temp 97.4°F | Ht 67.0 in | Wt 202.0 lb

## 2015-05-26 DIAGNOSIS — R1011 Right upper quadrant pain: Secondary | ICD-10-CM | POA: Diagnosis not present

## 2015-05-26 DIAGNOSIS — G8929 Other chronic pain: Secondary | ICD-10-CM | POA: Diagnosis not present

## 2015-05-26 DIAGNOSIS — R1010 Upper abdominal pain, unspecified: Secondary | ICD-10-CM

## 2015-05-26 NOTE — Patient Instructions (Signed)
You will need to have an Ultrasound. Central Scheduling will call you with an appointment. If you do not hear from their office by Monday at noon, please call our office.  We will call you with results and recommendations for follow-up.

## 2015-05-26 NOTE — Progress Notes (Signed)
Surgical Consultation  05/26/2015  Denise Schaefer is an 54 y.o. female. With abdominal pain in her right upper quadrant.  Chief Complaint  Patient presents with  . Abdominal Pain  . Nausea  . Vomiting     HPI: She was seen several years ago with chronic abdominal pain and workup at that time was negative for biliary tract disease. She has known Crohn's disease who was last evaluated a year ago. She's been having increasing abdominal symptoms with persistent right upper quadrant pain and nausea lasting a day or 200 time. Her last episode was 3 weeks ago. She's had no further workup. She is having marked diarrhea consistent with her known Crohn's disease. She has undergone multiple treatments including immunologic therapy. She is currently followed at [facility] by the gastroenterology department there. She is here to discuss further intervention/evaluation of her abdominal pain.  Past Medical History  Diagnosis Date  . Anxiety   . GERD (gastroesophageal reflux disease)   . Umbilical hernia   . Crohn's disease   . Tachycardia, unspecified     after cervical fusion  . 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 Hypertension age 37    Past Surgical History  Procedure Laterality Date  . Back surgery      cervical fusion  . Colonoscopy    . Upper gi endoscopy  2013    Family History  Problem Relation Age of Onset  . Colon cancer Neg Hx   . Colon polyps Neg Hx   . Crohn's disease      2ND/3RD COUSINS    Social History:  reports that she quit smoking about 3 years ago. Her smoking use included Cigarettes. She has a 22.5 pack-year smoking history. She has never used smokeless tobacco. She reports that she does not drink alcohol or use illicit drugs.  Allergies:  Allergies  Allergen Reactions  . Penicillins Rash  . Sulfa Antibiotics Hives  . Asacol [Mesalamine]     DIARRHEA  . Humira [Adalimumab]     rash  . Oxycodone Hives  . Remicade [Infliximab]     ELEVATED LIVER ENZYMES  .  Tramadol Hives    Itching and hives     Medications reviewed.     Review of Systems  Constitutional: Positive for weight loss. Negative for fever and chills.  HENT: Negative for congestion.   Eyes: Negative.   Respiratory: Negative for cough, shortness of breath and wheezing.   Cardiovascular: Negative for chest pain and orthopnea.  Gastrointestinal: Positive for heartburn, nausea, abdominal pain and diarrhea.  Genitourinary: Negative.   Musculoskeletal: Positive for back pain and joint pain.  Neurological: Negative.  Negative for headaches.  Psychiatric/Behavioral: Positive for depression. Negative for suicidal ideas.       BP 121/88 mmHg  Pulse 69  Temp(Src) 97.4 F (36.3 C) (Oral)  Ht  (1.702 m)  Wt 202 lb (91.627 kg)  BMI 31.63 kg/m2  Physical Exam  Constitutional: She is oriented to person, place, and time and well-developed, well-nourished, and in no distress. No distress.  HENT:  Head: Normocephalic and atraumatic.  Eyes: Conjunctivae are normal. Pupils are equal, round, and reactive to light.  Neck: Normal range of motion. Neck supple.  Cardiovascular: Regular rhythm and normal heart sounds.   Pulmonary/Chest: Effort normal and breath sounds normal.  Abdominal: Soft. Bowel sounds are normal. There is tenderness. There is no rebound and no guarding.  Musculoskeletal: Normal range of motion. She exhibits no edema.  Neurological: She  is alert and oriented to person, place, and time.  Skin: Skin is warm and dry.  Psychiatric: Mood and affect normal.      No results found for this or any previous visit (from the past 48 hour(s)). No results found.  Assessment/Plan: 1. Abdominal pain, chronic, right upper quadrant I'm really not sure how she got to our office for evaluation other than we saw her multiple years ago. She's had no further evaluation. She is quite concerned that she has gallbladder disease but has had no recent workup to evaluate that  possibility. She is very anxious very upset and tearful in the office today. I'm not certain which direction to go here. I don't have any records on her current therapy for her Crohn's disease. I suspect much of her abdominal pain is related to her Crohn's disease. We will repeat her gallbladder workup at the present time but currently I do not see any indication for surgical intervention. She was not happy to hear that but does appear to understand our plan. - US Abdomen Limited RUQ; Future  2. Abdominal pain, right upper quadrant Workup as above   Tiney Rouge III dermatitis

## 2015-05-29 ENCOUNTER — Ambulatory Visit
Admission: RE | Admit: 2015-05-29 | Discharge: 2015-05-29 | Disposition: A | Discharge status: Home / Self Care | Payer: Medicare Other | Source: Ambulatory Visit | Attending: Surgery | Admitting: Surgery

## 2015-05-29 DIAGNOSIS — R1010 Upper abdominal pain, unspecified: Secondary | ICD-10-CM | POA: Insufficient documentation

## 2015-05-29 DIAGNOSIS — R1011 Right upper quadrant pain: Secondary | ICD-10-CM

## 2015-05-29 DIAGNOSIS — G8929 Other chronic pain: Secondary | ICD-10-CM | POA: Insufficient documentation

## 2015-06-01 ENCOUNTER — Telehealth: Payer: Self-pay

## 2015-06-01 DIAGNOSIS — R1011 Right upper quadrant pain: Principal | ICD-10-CM

## 2015-06-01 DIAGNOSIS — G8929 Other chronic pain: Secondary | ICD-10-CM

## 2015-06-01 NOTE — Telephone Encounter (Signed)
Called patient at this time to explain results of Abdominal US and also to speak with her about scheduling HIDA Scan.  Answered all questions to patient's satisfaction.  Orders placed at this time.  Will call patient when results of HIDA are in with next step in this process.

## 2015-06-01 NOTE — Telephone Encounter (Signed)
-----   Message from Tiney Rouge III, MD sent at 05/29/2015  7:16 PM EDT ----- Needs HIDA scan with [facility]

## 2015-06-04 ENCOUNTER — Ambulatory Visit
Admission: RE | Admit: 2015-06-04 | Discharge: 2015-06-04 | Disposition: A | Discharge status: Home / Self Care | Payer: Medicare Other | Source: Ambulatory Visit | Attending: Surgery | Admitting: Surgery

## 2015-06-04 DIAGNOSIS — R1010 Upper abdominal pain, unspecified: Secondary | ICD-10-CM | POA: Diagnosis not present

## 2015-06-04 DIAGNOSIS — R1011 Right upper quadrant pain: Secondary | ICD-10-CM

## 2015-06-04 DIAGNOSIS — G8929 Other chronic pain: Secondary | ICD-10-CM | POA: Diagnosis present

## 2015-06-04 MED ORDER — SINCALIDE 5 MCG IJ SOLR
0.0200 ug/kg | Freq: Once | INTRAMUSCULAR | Status: AC
Start: 2015-06-04 — End: 2015-06-04
  Administered 2015-06-04: 1.84 ug via INTRAVENOUS

## 2015-06-04 MED ORDER — TECHNETIUM TC 99M MEBROFENIN IV KIT
5.0000 | PACK | Freq: Once | INTRAVENOUS | Status: AC | PRN
Start: 2015-06-04 — End: 2015-06-04
  Administered 2015-06-04: 4.95 via INTRAVENOUS

## 2015-06-08 ENCOUNTER — Telehealth: Payer: Self-pay

## 2015-06-08 NOTE — Telephone Encounter (Signed)
Call made to patient at this time to schedule appointment to review HIDA Scan.  Patient wanted to know what HIDA Scan showed, explained that there was a note that she was having pain during the scan and patient confirms this.  She states that she has had several episodes of this same pain since her HIDA scan.   Appointment made 06/19/15 at 11:30am with Dr. Michela Pitcher in [place]. Pt understands details of this appointment.

## 2015-06-19 ENCOUNTER — Ambulatory Visit (INDEPENDENT_AMBULATORY_CARE_PROVIDER_SITE_OTHER): Payer: Medicare Other | Admitting: Surgery

## 2015-06-19 ENCOUNTER — Encounter: Payer: Self-pay | Admitting: Surgery

## 2015-06-19 VITALS — BP 119/86 | HR 72 | Temp 98.0°F | Ht 67.0 in | Wt 199.0 lb

## 2015-06-19 DIAGNOSIS — R1013 Epigastric pain: Secondary | ICD-10-CM

## 2015-06-19 NOTE — Patient Instructions (Signed)
Please follow-up with your Crohn's specialist at [facility].

## 2015-06-19 NOTE — Progress Notes (Signed)
  Surgical Consultation  06/19/2015  Denise Schaefer is an 54 y.o. female.   Chief Complaint  Patient presents with  . Results    HIDA Scan     HPI: She is back following her HIDA scan. HIDA scan demonstrated excellent ejection fraction with normal visualization and filling. There did not appear to be any abnormalities or evidence for biliary tract disease. Her symptoms continue to be significant abdominal pain bloating indigestion.  Past Medical History  Diagnosis Date  . Anxiety   . GERD (gastroesophageal reflux disease)   . Umbilical hernia   . Crohn's disease   . Tachycardia, unspecified     after cervical fusion  . 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 Hypertension age 8    Past Surgical History  Procedure Laterality Date  . Back surgery      cervical fusion  . Colonoscopy    . Upper gi endoscopy  2013    Family History  Problem Relation Age of Onset  . Colon cancer Neg Hx   . Colon polyps Neg Hx   . Crohn's disease      2ND/3RD COUSINS    Social History:  reports that she quit smoking about 3 years ago. Her smoking use included Cigarettes. She has a 22.5 pack-year smoking history. She has never used smokeless tobacco. She reports that she does not drink alcohol or use illicit drugs.  Allergies:  Allergies  Allergen Reactions  . Penicillins Rash  . Sulfa Antibiotics Hives  . Asacol [Mesalamine]     DIARRHEA  . Humira [Adalimumab]     rash  . Oxycodone Hives  . Remicade [Infliximab]     ELEVATED LIVER ENZYMES  . Tramadol Hives    Itching and hives     Medications reviewed.     ROS her review of systems is unchanged.     BP 119/86 mmHg  Pulse 72  Temp(Src) 98 F (36.7 C) (Oral)  Ht  (1.702 m)  Wt 199 lb (90.266 kg)  BMI 31.16 kg/m2  Physical Exam no physical examination was performed today.    No results found for this or any previous visit (from the past 48 hour(s)). No results found.  Assessment/Plan: 1. Abdominal pain,  epigastric We talked at length. She's were frustrated but I do not see a surgical solution to her problem. We recommended she consider [facility] Cro[facility]. She is comfortable with that plan. Greater than 50% of our time today was spent in clinical counseling. We will see her back as necessary.   Tiney Rouge III dermatitis

## 2015-06-25 ENCOUNTER — Encounter: Payer: Self-pay | Admitting: Emergency Medicine

## 2015-06-25 ENCOUNTER — Emergency Department
Admission: EM | Admit: 2015-06-25 | Discharge: 2015-06-25 | Disposition: A | Discharge status: Home / Self Care | Payer: Medicare Other | Attending: Emergency Medicine | Admitting: Emergency Medicine

## 2015-06-25 DIAGNOSIS — Z880 Allergy status to penicillin: Secondary | ICD-10-CM | POA: Insufficient documentation

## 2015-06-25 DIAGNOSIS — I10 Essential (primary) hypertension: Secondary | ICD-10-CM | POA: Diagnosis not present

## 2015-06-25 DIAGNOSIS — Z79899 Other long term (current) drug therapy: Secondary | ICD-10-CM | POA: Diagnosis not present

## 2015-06-25 DIAGNOSIS — M25551 Pain in right hip: Secondary | ICD-10-CM | POA: Insufficient documentation

## 2015-06-25 DIAGNOSIS — Z87891 Personal history of nicotine dependence: Secondary | ICD-10-CM | POA: Insufficient documentation

## 2015-06-25 MED ORDER — HYDROCODONE-ACETAMINOPHEN 5-325 MG PO TABS: 1.0000 | ORAL_TABLET | ORAL | Status: DC | PRN

## 2015-06-25 NOTE — Discharge Instructions (Signed)

## 2015-06-25 NOTE — ED Notes (Signed)
MD at bedside to assess patient at this time.

## 2015-06-25 NOTE — ED Provider Notes (Signed)
[facility] Emergency Department Provider Note  ____________________________________________  Time seen: On arrival  I have reviewed the triage vital signs and the nursing notes.   HISTORY  Chief Complaint Groin Pain    HPI Denise Schaefer is a 54 y.o. female who presents with complaints of right hip pain. She has had this for some time. She reports the pain is aching in nature and moderate in intensity. It is worse with pressure to the area. She took some Vicodin with some relief. She saw her primary care physician for similar complaint within the last few weeks who requested that she see her orthopedist for possible injections but that has not happened yet. Patient requests injection here. She has no abdominal pain. No fevers no chills. She has a history of Crohn's and reports compliance with her medication.     Past Medical History  Diagnosis Date  . Anxiety   . GERD (gastroesophageal reflux disease)   . Umbilical hernia   . Crohn's disease   . Tachycardia, unspecified     after cervical fusion  . 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 Hypertension age 48    Patient Active Problem List   Diagnosis Date Noted  . Crohn's disease of both small and large intestine with intestinal obstruction 11/06/2014  . Umbilical hernia 03/20/2013    Past Surgical History  Procedure Laterality Date  . Back surgery      cervical fusion  . Colonoscopy    . Upper gi endoscopy  2013    Current Outpatient Rx  Name  Route  Sig  Dispense  Refill  . azathioprine (IMURAN) 100 MG tablet      2 PO DAILY Patient taking differently: Take 50 mg by mouth 2 (two) times daily. 2 PO DAILY   60 tablet   11   . azelastine (ASTELIN) 0.1 % nasal spray   Each Nare   Place 1 spray into both nostrils 2 (two) times daily. Use in each nostril as directed         . busPIRone (BUSPAR) 7.5 MG tablet   Oral   Take 7.5 mg by mouth 2 (two) times daily.         . hydrochlorothiazide  (HYDRODIURIL) 25 MG tablet   Oral   Take 25 mg by mouth daily.         Marland Kitchen HYDROcodone-acetaminophen (NORCO/VICODIN) 5-325 MG per tablet   Oral   Take 1 tablet by mouth every 4 (four) hours as needed for moderate pain.   20 tablet   0   . hydrocortisone (ANUSOL-HC) 2.5 % rectal cream   Rectal   Place 1 application rectally 2 (two) times daily.         Marland Kitchen levocetirizine (XYZAL) 5 MG tablet   Oral   Take 5 mg by mouth every evening.         . pantoprazole (PROTONIX) 40 MG tablet   Oral   Take 40 mg by mouth 2 (two) times daily.         . polyethylene glycol (MIRALAX / GLYCOLAX) packet   Oral   Take 17 g by mouth daily as needed.            Allergies Penicillins; Sulfa antibiotics; Asacol; Humira; Oxycodone; Remicade; and Tramadol  Family History  Problem Relation Age of Onset  . Colon cancer Neg Hx   . Colon polyps Neg Hx   . Crohn's disease      2ND/3RD  COUSINS    Social History Social History  Substance Use Topics  . Smoking status: Former Smoker -- 1.50 packs/day for 15 years    Types: Cigarettes    Quit date: 12/01/2011  . Smokeless tobacco: Never Used  . Alcohol Use: No    Review of Systems  Constitutional: Negative for fever. Eyes: Negative for visual changes. Cardiovascular: Negative for chest pain. Respiratory: Negative for cough Gastrointestinal: Negative for abdominal pain Genitourinary: Negative for dysuria. Musculoskeletal: Positive for hip pain Skin: Negative for rash. Neurological: Negative for  focal weakness Psychiatric: Positive for anxiety    ____________________________________________   PHYSICAL EXAM:  VITAL SIGNS: ED Triage Vitals  Enc Vitals Group     BP 06/25/15 0748 120/89 mmHg     Pulse Rate 06/25/15 0748 75     Resp 06/25/15 0748 18     Temp 06/25/15 0748 97.5 F (36.4 C)     Temp Source 06/25/15 0748 Oral     SpO2 06/25/15 0748 98 %     Weight 06/25/15 0748 200 lb (90.719 kg)     Height 06/25/15 0748   (1.702 m)     Head Cir --      Peak Flow --      Pain Score 06/25/15 0748 10     Pain Loc --      Pain Edu? --      Excl. in GC? --      Constitutional: Alert and oriented. Well appearing and in no distress. Anxious Eyes: Conjunctivae are normal.  ENT   Head: Normocephalic and atraumatic.   Mouth/Throat: Mucous membranes are moist. Cardiovascular:  Normal and symmetric distal pulses are present in all extremities.  Respiratory: Normal respiratory effort without tachypnea nor retractions. Gastrointestinal: Soft and non-tender in all quadrants. No distention. There is no CVA tenderness. Genitourinary: deferred Musculoskeletal: Patient with tenderness to palpation along the right gluteus maximus muscle and she also has discomfort in the right hip joint with range of motion of her right leg. She is able to ambulate with a slight limp. Extremities warm and well perfused and she has no calf pain or swelling. Neurologic:  Normal speech and language. No gross focal neurologic deficits are appreciated. Skin:  Skin is warm, dry and intact. No rash noted.   ____________________________________________    LABS (pertinent positives/negatives)  Labs Reviewed - No data to display  ____________________________________________   EKG  None  ____________________________________________    RADIOLOGY I have personally reviewed any xrays that were ordered on this patient: None  ____________________________________________   PROCEDURES  Procedure(s) performed: none  Critical Care performed: none  ____________________________________________   INITIAL IMPRESSION / ASSESSMENT AND PLAN / ED COURSE  Pertinent labs & imaging results that were available during my care of the patient were reviewed by me and considered in my medical decision making (see chart for details).  Patient reports she has had an x-ray in the past and does intend to follow up with her orthopedist. She is  disappointed with me that we do not do hip injections in the emergency department. Her vitals are normal. Blood pressure is well-controlled. She has no abdominal tenderness to palpation. She has no weakness or numbness. I'm suspicious of hip arthritis as a cause of her pain. Strongly recommended with a follow-up for definitive diagnosis and treatment  ____________________________________________   FINAL CLINICAL IMPRESSION(S) / ED DIAGNOSES  Final diagnoses:  Hip pain, right     Jene Every, MD 06/25/15 [phone]

## 2015-06-25 NOTE — ED Notes (Signed)
Pt with pain in right low back and leg and also into right groin.  Urine is slow as well.  Started last Friday.   Took pain pills from previous illness and they help, but pain comes back when they wear off.  Hx chrons, but says doesn't feel linke theat

## 2016-04-20 IMAGING — NM NM HEPATO W/GB/PHARM/[PERSON_NAME]
3 series · 18 of 18 positions shown · non-contrast
Comparison: Limited abdominal ultrasound May 29, 2015

CLINICAL DATA: Chronic right upper quadrant abdominal pain with
intermittent nausea and vomiting for the past month, history of
Crohn's disease

EXAM:
NUCLEAR MEDICINE HEPATOBILIARY IMAGING WITH GALLBLADDER EF
TECHNIQUE: Sequential images of the abdomen were obtained [DATE] minutes
following intravenous administration of radiopharmaceutical. After
slow intravenous infusion of 1.84 micrograms Cholecystokinin,
gallbladder ejection fraction was determined.
RADIOPHARMACEUTICALS:  4.95 mCi 9c-EEm Choletec IV

[Series 1000: hepatobiliary dynamic · 9.59mm/px · 6 of 60 frames shown]
[frame 6/60]
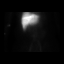
[frame 16/60]
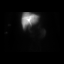
[frame 26/60]
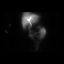
[frame 36/60]
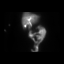
[frame 46/60]
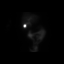
[frame 56/60]
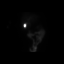

[Series 1000: gallbladder ef dynamic · 4.80mm/px · 6 of 120 frames shown]
[frame 11/120]
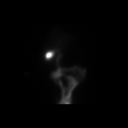
[frame 31/120]
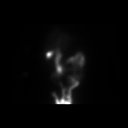
[frame 51/120]
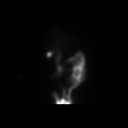
[frame 71/120]
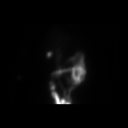
[frame 91/120]
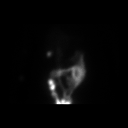
[frame 111/120]
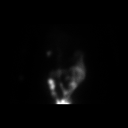

[Series 1000: gallbladder ef dynamic (results) · 4.80mm/px · 6 of 120 frames shown]
[frame 11/120]
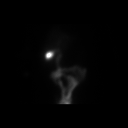
[frame 31/120]
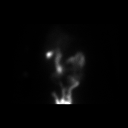
[frame 51/120]
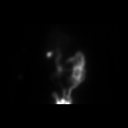
[frame 71/120]
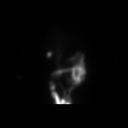
[frame 91/120]
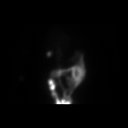
[frame 111/120]
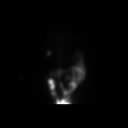

[18 of 18 positions shown; findings below may reference images not displayed]

FINDINGS: There is adequate uptake of the radiopharmaceutical by the liver.
Intrahepatic and common bile duct activity is visible by 15 minutes.
The gallbladder becomes visible by approximately 40 minutes.
Following CCK administration the 45 minutes gallbladder ejection
fraction is 95%. The patient experienced mild abdominal cramping
during the CCK administration.. At 45 min, normal ejection fraction
is greater than 40%.
IMPRESSION: Normal hepatobiliary scan with normal gallbladder ejection fraction.

## 2016-06-15 NOTE — Telephone Encounter (Signed)
PT ON IMURAN AND FOLLOWING WITH [facility].

## 2017-04-19 ENCOUNTER — Ambulatory Visit (INDEPENDENT_AMBULATORY_CARE_PROVIDER_SITE_OTHER): Payer: Medicare Other | Admitting: Obstetrics & Gynecology

## 2017-04-19 ENCOUNTER — Encounter: Payer: Self-pay | Admitting: Obstetrics & Gynecology

## 2017-04-19 VITALS — BP 120/80 | HR 77 | Ht 67.0 in | Wt 200.0 lb

## 2017-04-19 DIAGNOSIS — R232 Flushing: Secondary | ICD-10-CM | POA: Diagnosis not present

## 2017-04-19 DIAGNOSIS — R1031 Right lower quadrant pain: Secondary | ICD-10-CM | POA: Diagnosis not present

## 2017-04-19 DIAGNOSIS — N898 Other specified noninflammatory disorders of vagina: Secondary | ICD-10-CM

## 2017-04-19 MED ORDER — GABAPENTIN 300 MG PO CAPS: 300.0000 mg | ORAL_CAPSULE | Freq: Three times a day (TID) | ORAL | 11 refills | Status: DC

## 2017-04-19 NOTE — Patient Instructions (Signed)
REPLENS twice weekly for vaginal dryness    Intrarosa, Osphena as next option choices  Increase GABAPENTIN to 300 mg three times a day  Ultrasound soon for ovary.

## 2017-04-19 NOTE — Progress Notes (Signed)
History of Present Illness:  Denise Schaefer is a 56 y.o. who was started on GABAPENTIN approximately 8 months ago. Since that time, she states that her symptoms are improving, not complete yet. Still has vag dryness, some discomfort w intercourse. Recent RLQ pain, no radiation, no modifiers, no assoc sx's, mild, 3 mos duration, no context.  No VB, no PMB.  PMHx: She  has a past medical history of Anxiety; Arthritis (1994); Crohn's disease (HCC); GERD (gastroesophageal reflux disease); Hypertension (age 73); Tachycardia, unspecified; and Umbilical hernia. Also,  has a past surgical history that includes Back surgery; Colonoscopy; and Upper gi endoscopy (2013)., family history is not on file.,  reports that she quit smoking about 5 years ago. Her smoking use included Cigarettes. She has a 22.50 pack-year smoking history. She has never used smokeless tobacco. She reports that she does not drink alcohol or use drugs. Current Meds  Medication Sig  . amLODipine (NORVASC) 10 MG tablet Take 10 mg by mouth.  Marland Kitchen azathioprine (IMURAN) 100 MG tablet 2 PO DAILY (Patient taking differently: Take 50 mg by mouth 2 (two) times daily. 2 PO DAILY)  . azelastine (ASTELIN) 0.1 % nasal spray Place 1 spray into both nostrils 2 (two) times daily. Use in each nostril as directed  . Biotin 2500 MCG CAPS Take by mouth.  . busPIRone (BUSPAR) 7.5 MG tablet Take 7.5 mg by mouth 2 (two) times daily.  . fluticasone (FLONASE) 50 MCG/ACT nasal spray Place into the nose.  . folic acid (FOLVITE) 1 MG tablet   . hydrochlorothiazide (HYDRODIURIL) 25 MG tablet Take 25 mg by mouth daily.  Marland Kitchen HYDROcodone-acetaminophen (NORCO/VICODIN) 5-325 MG per tablet Take 1 tablet by mouth every 4 (four) hours as needed for moderate pain.  . hydrocortisone (ANUSOL-HC) 2.5 % rectal cream Place 1 application rectally 2 (two) times daily.  . hydrocortisone (CORTENEMA) 100 MG/60ML enema   . hydrocortisone (PROCTOZONE-HC) 2.5 % rectal cream APPLY RECATLLY  TWICE DAILY AS NEEDED  . levocetirizine (XYZAL) 5 MG tablet Take 5 mg by mouth every evening.  . methotrexate (RHEUMATREX) 2.5 MG tablet TK 4 TS PO ONCE A WEEK  . pantoprazole (PROTONIX) 40 MG tablet Take 40 mg by mouth 2 (two) times daily.  . [DISCONTINUED] azelastine (ASTELIN) 0.1 % nasal spray 2 sprays by Each Nare route Two (2) times a day.  . [DISCONTINUED] gabapentin (NEURONTIN) 100 MG capsule   . [DISCONTINUED] polyethylene glycol (MIRALAX / GLYCOLAX) packet Take by mouth.  . Also, is allergic to penicillins; remicade [infliximab]; sulfa antibiotics; asacol [mesalamine]; budesonide; humira [adalimumab]; oxycodone; and tramadol..  Review of Systems  Constitutional: Negative for chills, fever and malaise/fatigue.  HENT: Negative for congestion, sinus pain and sore throat.   Eyes: Negative for blurred vision and pain.  Respiratory: Negative for cough and wheezing.   Cardiovascular: Negative for chest pain and leg swelling.  Gastrointestinal: Negative for abdominal pain, constipation, diarrhea, heartburn, nausea and vomiting.  Genitourinary: Negative for dysuria, frequency, hematuria and urgency.  Musculoskeletal: Negative for back pain, joint pain, myalgias and neck pain.  Skin: Negative for itching and rash.  Neurological: Negative for dizziness, tremors and weakness.  Endo/Heme/Allergies: Does not bruise/bleed easily.  Psychiatric/Behavioral: Negative for depression. The patient is not nervous/anxious and does not have insomnia.     Physical Exam:  BP 120/80   Pulse 77   Ht 5\' 7"  (1.702 m)   Wt 200 lb (90.7 kg)   BMI 31.32 kg/m  Body mass index is 31.32 kg/m. Constitutional:  Well nourished, well developed female in no acute distress.  Abdomen: diffusely non tender to palpation, non distended, and no masses, hernias Neuro: Grossly intact Psych:  Normal mood and affect.    Assessment:  Problem List Items Addressed This Visit      Cardiovascular and Mediastinum   Hot  flashes   Relevant Medications   amLODipine (NORVASC) 10 MG tablet     Genitourinary   Vaginal dryness     Other   Right lower quadrant pain - Primary   Relevant Orders   US Transvaginal Non-OB     Medication treatment is going marginally for her hot flashes.  Plan: She will undergo increase to 300 mg TID in her medical therapy. Replens and other options for vag dryness discussed and advised. US to assess ovary and uterus in regards to RLQ pain.  She was amenable to this plan and we will see her back for annual/PRN.  Annamarie MajorPaul Diamon Reddinger, MD, Merlinda FrederickFACOG [facility], [facility] 04/19/2017  4:21 PM

## 2017-05-15 ENCOUNTER — Ambulatory Visit (INDEPENDENT_AMBULATORY_CARE_PROVIDER_SITE_OTHER): Payer: Medicare Other

## 2017-05-15 ENCOUNTER — Ambulatory Visit (INDEPENDENT_AMBULATORY_CARE_PROVIDER_SITE_OTHER): Payer: Medicare Other | Admitting: Obstetrics & Gynecology

## 2017-05-15 ENCOUNTER — Encounter: Payer: Self-pay | Admitting: Obstetrics & Gynecology

## 2017-05-15 VITALS — BP 120/80 | HR 73 | Ht 67.0 in | Wt 201.0 lb

## 2017-05-15 DIAGNOSIS — N898 Other specified noninflammatory disorders of vagina: Secondary | ICD-10-CM

## 2017-05-15 DIAGNOSIS — R1031 Right lower quadrant pain: Secondary | ICD-10-CM | POA: Diagnosis not present

## 2017-05-15 NOTE — Progress Notes (Signed)
  HPI: Abdominal Pain Patient presents for evaluation of abdominal pain. The pain is described as sharp and shooting, and is 6/10 in intensity. Pain is located in the RLQ area without radiation. Onset was gradual occurring several months ago. Symptoms have been unchanged since. Aggravating factors: activity. Alleviating factors: none. Associated symptoms: none. The patient denies constipation, diarrhea, dysuria, fever, nausea and vomiting. Risk factors for pelvic/abdominal pain include none. Hot flashes have improved on Gabapentin slightly, pain has not improved.  Ultrasound demonstrates no masses seen These findings are Normal for tubes, ovaries, uterus.  Review of ULTRASOUND.    [facility] personally reviewed images [facility]    Plan of management to be discussed with patient.  PMHx: She  has a past medical history of Anxiety; Arthritis (1994); Crohn's disease (HCC); GERD (gastroesophageal reflux disease); Hypertension (age 56); Tachycardia, unspecified; and Umbilical hernia. Also,  has a past surgical history that includes Back surgery; Colonoscopy; and Upper gi endoscopy (2013)., family history includes Crohn's disease in her unknown relative.,  reports that she quit smoking about 5 years ago. Her smoking use included Cigarettes. She has a 22.50 pack-year smoking history. She has never used smokeless tobacco. She reports that she does not drink alcohol or use drugs.  She has a current medication list which includes the following prescription(s): amlodipine, azathioprine, azelastine, biotin, buspirone, fluticasone, folic acid, gabapentin, hydrochlorothiazide, hydrocodone-acetaminophen, hydrocortisone, hydrocortisone, hydrocortisone, levocetirizine, methotrexate, pantoprazole, and polyethylene glycol. Also, is allergic to penicillins; remicade [infliximab]; sulfa antibiotics; asacol [mesalamine]; budesonide; humira [adalimumab]; oxycodone; and tramadol.  Review  of Systems  All other systems reviewed and are negative.   Objective: BP 120/80   Pulse 73   Ht 5\' 7"  (1.702 m)   Wt 201 lb (91.2 kg)   BMI 31.48 kg/m   Physical examination Constitutional NAD, Conversant  Skin No rashes, lesions or ulceration.   Extremities: Moves all appropriately.  Normal ROM for age. No lymphadenopathy.  Neuro: Grossly intact  Psych: Oriented to PPT.  Normal mood. Normal affect.   Assessment:  Right lower quadrant pain - Plan: Ambulatory referral to General Surgery     No GYN source ellicited Vaginal dryness and hot flashes     Cont current dose of Gabapentin (increased last month to 300 mg TID)  Annamarie MajorPaul Connor Foxworthy, MD, Merlinda FrederickFACOG [facility], [facility] 05/15/2017  11:27 AM

## 2017-05-16 ENCOUNTER — Encounter: Payer: Self-pay | Admitting: General Surgery

## 2017-05-25 ENCOUNTER — Ambulatory Visit (INDEPENDENT_AMBULATORY_CARE_PROVIDER_SITE_OTHER): Payer: Medicare Other | Admitting: General Surgery

## 2017-05-25 ENCOUNTER — Encounter: Payer: Self-pay | Admitting: General Surgery

## 2017-05-25 VITALS — BP 142/84 | HR 76 | Resp 14 | Ht 67.0 in | Wt 201.0 lb

## 2017-05-25 DIAGNOSIS — R1031 Right lower quadrant pain: Secondary | ICD-10-CM

## 2017-05-25 NOTE — Progress Notes (Signed)
Patient ID: Denise PearsonConnie F Howington, female   DOB: Nov 16, 1960, 56 y.o.   MRN: [id number]  Chief Complaint  Patient presents with  . Abdominal Pain    HPI Denise Schaefer is a 56 y.o. female.  Here for right lower  groin pain referred by Dr Tiburcio PeaHarris. She states even riding a bike at the gym or squatting hurts. She  Does not feel a knot but it feels "puffy". She has noticed this for a year but it has gotten worse. She is having trouble with her back and hip as well. She states that sometimes she can press her back and it takes the pressure off the groin. She does have some diarrhea on occasion with Crohns disease. No change in baseline symptoms related to her inflammatory bowel disease.  HPI  Past Medical History:  Diagnosis Date  . Anxiety   . Arthritis 1994   knees  . Crohn's disease (HCC)   . GERD (gastroesophageal reflux disease)   . Hypertension age 56  . Tachycardia, unspecified    after cervical fusion  . Umbilical hernia     Past Surgical History:  Procedure Laterality Date  . BACK SURGERY     cervical fusion  . CARPAL TUNNEL RELEASE Right 06/03/2013  . COLONOSCOPY    . UMBILICAL HERNIA REPAIR  03/20/2013  . UPPER GI ENDOSCOPY  2013    Family History  Problem Relation Age of Onset  . Crohn's disease Unknown        2ND/3RD COUSINS  . Colon cancer Neg Hx   . Colon polyps Neg Hx     Social History Social History  Substance Use Topics  . Smoking status: Former Smoker    Packs/day: 1.50    Years: 15.00    Types: Cigarettes    Quit date: 12/01/2011  . Smokeless tobacco: Never Used  . Alcohol use No    Allergies  Allergen Reactions  . Penicillins Rash  . Remicade [Infliximab] Other (See Comments)    Drug induced liver injury ELEVATED LIVER ENZYMES  . Sulfa Antibiotics Hives and Rash  . Asacol [Mesalamine]     DIARRHEA  . Budesonide Other (See Comments)    Dry mouth, sore throat and ears and dizziness  . Humira [Adalimumab]     rash  . Oxycodone Hives  .  Tramadol Hives    Itching and hives   . Mercaptopurine Rash    Current Outpatient Prescriptions  Medication Sig Dispense Refill  . amLODipine (NORVASC) 10 MG tablet Take 10 mg by mouth.    Marland Kitchen. azelastine (ASTELIN) 0.1 % nasal spray Place 1 spray into both nostrils as needed. Use in each nostril as directed     . Biotin 2500 MCG CAPS Take by mouth.    . fluticasone (FLONASE) 50 MCG/ACT nasal spray Place into the nose.    . folic acid (FOLVITE) 1 MG tablet   3  . gabapentin (NEURONTIN) 300 MG capsule Take 1 capsule (300 mg total) by mouth 3 (three) times daily. 90 capsule 11  . hydrocortisone (CORTENEMA) 100 MG/60ML enema   1  . hydrocortisone (PROCTOZONE-HC) 2.5 % rectal cream APPLY RECATLLY TWICE DAILY AS NEEDED    . mirtazapine (REMERON) 7.5 MG tablet Take 7.5 mg by mouth at bedtime.    . pantoprazole (PROTONIX) 40 MG tablet Take 40 mg by mouth 2 (two) times daily.    . polyethylene glycol (MIRALAX / GLYCOLAX) packet Take 17 g by mouth daily as needed.     .Marland Kitchen  levocetirizine (XYZAL) 5 MG tablet Take 5 mg by mouth every evening.     No current facility-administered medications for this visit.     Review of Systems Review of Systems  Constitutional: Negative.   Respiratory: Negative.   Cardiovascular: Negative.   Gastrointestinal: Positive for abdominal pain and diarrhea. Negative for constipation.    Blood pressure (!) 142/84, pulse 76, resp. rate 14, height 5\' 7"  (1.702 m), weight 201 lb (91.2 kg).  Physical Exam Physical Exam  Constitutional: She is oriented to person, place, and time. She appears well-developed and well-nourished.  Cardiovascular: Normal rate, regular rhythm and normal heart sounds.   Pulmonary/Chest: Effort normal and breath sounds normal.  Abdominal: Soft. Bowel sounds are normal. There is no tenderness.    Neurological: She is alert and oriented to person, place, and time.  Skin: Skin is warm and dry.    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 Otherwise relatively normal colon.  Assessment    No evidence of abdominal wall hernia.     Plan    The patient was encouraged to call should she develop worsening symptoms or pronounced bulge. Would be glad to reassess that any time.    Patient to return as needed. The patient is aware to call back for any questions or concerns.   HPI, Physical Exam, Assessment and Plan have been scribed under the direction and in the presence of Donnalee CurryJeffrey Eathan Groman, MD.  Ples SpecterJessica Qualls, CMA  I have completed the exam and reviewed the above documentation for accuracy and completeness.  I agree with the above.  [employer] has been used and any errors in dictation or transcription are unintentional.  Donnalee CurryJeffrey Dewan Emond, M.D., F.A.C.S.  Earline MayotteByrnett, Chandra Feger W 05/26/2017, 7:31 PM

## 2017-05-25 NOTE — Patient Instructions (Signed)
Patient to return as needed. The patient is aware to call back for any questions or concerns. 

## 2017-05-26 DIAGNOSIS — R1031 Right lower quadrant pain: Secondary | ICD-10-CM | POA: Insufficient documentation

## 2020-01-27 ENCOUNTER — Ambulatory Visit: Payer: Medicare Other | Attending: Internal Medicine

## 2020-02-03 ENCOUNTER — Ambulatory Visit: Payer: Medicare Other | Attending: Internal Medicine

## 2020-02-03 DIAGNOSIS — Z23 Encounter for immunization: Secondary | ICD-10-CM

## 2020-02-03 NOTE — Progress Notes (Signed)
   Covid-19 Vaccination Clinic  Name:  Denise Schaefer    MRN: [id number] DOB: 1961-04-28  02/03/2020  Denise Schaefer was observed post Covid-19 immunization for 30 minutes based on pre-vaccination screening without incident. She was provided with Vaccine Information Sheet and instruction to access the V-Safe system.   Denise Schaefer was instructed to call 911 with any severe reactions post vaccine: Marland Kitchen Difficulty breathing  . Swelling of face and throat  . A fast heartbeat  . A bad rash all over body  . Dizziness and weakness   Immunizations Administered    Name Date Dose VIS Date Route   Pfizer COVID-19 Vaccine 02/03/2020  9:54 AM 0.3 mL 10/11/2019 Intramuscular   Manufacturer: [employer], [employer]   Lot: [id number]   NDC: [address]-3

## 2020-02-24 ENCOUNTER — Ambulatory Visit: Payer: Medicare Other | Attending: Internal Medicine

## 2020-02-24 DIAGNOSIS — Z23 Encounter for immunization: Secondary | ICD-10-CM

## 2020-02-24 NOTE — Progress Notes (Signed)
   Covid-19 Vaccination Clinic  Name:  Denise Schaefer    MRN: [id number] DOB: 1960/11/12  02/24/2020  [name] was observed post Covid-19 immunization for 30 minutes based on pre-vaccination screening without incident. She was provided with Vaccine Information Sheet and instruction to access the V-Safe system.   [name] was instructed to call 911 with any severe reactions post vaccine: Marland Kitchen Difficulty breathing  . Swelling of face and throat  . A fast heartbeat  . A bad rash all over body  . Dizziness and weakness   Immunizations Administered    Name Date Dose VIS Date Route   Pfizer COVID-19 Vaccine 02/24/2020  9:26 AM 0.3 mL 12/25/2018 Intramuscular   Manufacturer: [employer], [employer]   Lot: [id number]   NDC: [address]-7

## 2021-10-05 DIAGNOSIS — E669 Obesity, unspecified: Secondary | ICD-10-CM | POA: Insufficient documentation

## 2021-12-13 ENCOUNTER — Emergency Department
Admission: EM | Admit: 2021-12-13 | Discharge: 2021-12-13 | Disposition: A | Discharge status: Home / Self Care | Payer: Medicare (Managed Care) | Attending: Emergency Medicine | Admitting: Emergency Medicine

## 2021-12-13 ENCOUNTER — Other Ambulatory Visit: Payer: Self-pay

## 2021-12-13 ENCOUNTER — Emergency Department: Payer: Medicare (Managed Care)

## 2021-12-13 DIAGNOSIS — R002 Palpitations: Secondary | ICD-10-CM | POA: Diagnosis not present

## 2021-12-13 DIAGNOSIS — R0602 Shortness of breath: Secondary | ICD-10-CM | POA: Diagnosis not present

## 2021-12-13 DIAGNOSIS — I10 Essential (primary) hypertension: Secondary | ICD-10-CM | POA: Insufficient documentation

## 2021-12-13 LAB — CBC
HCT: 37.7 % (ref 36.0–46.0)
Hemoglobin: 11.7 g/dL — ABNORMAL LOW (ref 12.0–15.0)
MCH: 26.2 pg (ref 26.0–34.0)
MCHC: 31 g/dL (ref 30.0–36.0)
MCV: 84.3 fL (ref 80.0–100.0)
Platelets: 288 10*3/uL (ref 150–400)
RBC: 4.47 MIL/uL (ref 3.87–5.11)
RDW: 14.6 % (ref 11.5–15.5)
WBC: 6.4 10*3/uL (ref 4.0–10.5)
nRBC: 0 % (ref 0.0–0.2)

## 2021-12-13 LAB — COMPREHENSIVE METABOLIC PANEL
ALT: 10 U/L (ref 0–44)
AST: 18 U/L (ref 15–41)
Albumin: 3.5 g/dL (ref 3.5–5.0)
Alkaline Phosphatase: 97 U/L (ref 38–126)
Anion gap: 5 (ref 5–15)
BUN: 10 mg/dL (ref 8–23)
CO2: 25 mmol/L (ref 22–32)
Calcium: 8.8 mg/dL — ABNORMAL LOW (ref 8.9–10.3)
Chloride: 106 mmol/L (ref 98–111)
Creatinine, Ser: 0.59 mg/dL (ref 0.44–1.00)
GFR, Estimated: >60 mL/min (ref >60)
Glucose, Bld: 95 mg/dL (ref 70–99)
Potassium: 4 mmol/L (ref 3.5–5.1)
Sodium: 136 mmol/L (ref 135–145)
Total Bilirubin: 0.5 mg/dL (ref 0.3–1.2)
Total Protein: 8 g/dL (ref 6.5–8.1)

## 2021-12-13 LAB — TROPONIN I (HIGH SENSITIVITY): Troponin I (High Sensitivity): 3 ng/L (ref <18)

## 2021-12-13 NOTE — ED Triage Notes (Signed)
Pt comes via EMs with c/o just not feeling well and HTN. Pt states she has taken her BP meds. Pt states hx of this and having to be hospitalized for it. Pt denies any other issues.  BP-152/125

## 2021-12-13 NOTE — ED Notes (Signed)
Patient transported to X-ray 

## 2021-12-13 NOTE — ED Provider Notes (Signed)
[facility] Provider Note    Event Date/Time   First MD Initiated Contact with Patient 12/13/21 1529     (approximate)   History   Hypertension   HPI  Denise Schaefer is a 61 y.o. female with a history of Crohn's disease, unspecified tachycardia, hypertension, anxiety who presents with complaints of palpitations coupled with shortness of breath.  Patient reports this has been ongoing over nearly a month now but seems to be becoming more frequent.  No chest pain.  Had an episode earlier today, now resolved.  Feels well now.  No calf pain or swelling.  No shortness of breath currently, no pleurisy.  No new medications     Physical Exam   Triage Vital Signs: ED Triage Vitals  Enc Vitals Group     BP 12/13/21 1204 129/89     Pulse Rate 12/13/21 1204 (!) 59     Resp 12/13/21 1204 18     Temp 12/13/21 1204 97.9 F (36.6 C)     Temp Source 12/13/21 1204 Oral     SpO2 12/13/21 1204 100 %     Weight 12/13/21 1204 88.5 kg (195 lb)     Height 12/13/21 1204 1.702 m (5\' 7" )     Head Circumference --      Peak Flow --      Pain Score 12/13/21 1157 0     Pain Loc --      Pain Edu? --      Excl. in [place]? --     Most recent vital signs: Vitals:   12/13/21 1204 12/13/21 1603  BP: 129/89 133/89  Pulse: (!) 59 (!) 57  Resp: 18 20  Temp: 97.9 F (36.6 C)   SpO2: 100% 100%     General: Awake, no distress. CV:  Good peripheral perfusion.  Regular rate and rhythm Resp:  Normal effort.  CTA B Abd:  No distention.  Other:  No calf pain or swelling   ED Results / Procedures / Treatments   Labs (all labs ordered are listed, but only abnormal results are displayed) Labs Reviewed  CBC - Abnormal; Notable for the following components:      Result Value   Hemoglobin 11.7 (*)    All other components within normal limits  COMPREHENSIVE METABOLIC PANEL - Abnormal; Notable for the following components:   Calcium 8.8 (*)    All other components within normal  limits  TROPONIN I (HIGH SENSITIVITY)     EKG  ED ECG REPORT I, Lavonia Drafts, the attending physician, personally viewed and interpreted this ECG.  Date: 12/13/2021  Rhythm: normal sinus rhythm QRS Axis: normal Intervals: normal ST/T Wave abnormalities: normal Narrative Interpretation: no evidence of acute ischemia    RADIOLOGY Chest x-ray reviewed by me, no acute abnormality    PROCEDURES:  Critical Care performed:   Procedures   MEDICATIONS ORDERED IN ED: Medications - No data to display   IMPRESSION / MDM / [place] / ED COURSE  I reviewed the triage vital signs and the nursing notes.    Patient presents with intermittent episodes of palpitations coupled with shortness of breath.  Seems to be ongoing for more than a week but perhaps is becoming more frequent.  Well-appearing here and asymptomatic.  EKG is reassuring.  High-sensitivity troponin is normal  CBC and BMP are unremarkable  Chest x-ray without abnormality  Considered admission for monitoring however the patient is asymptomatic well-appearing and has the ability to  follow-up closely with cardiology.  She would prefer to be discharged I think it is reasonable with strict return precautions.        FINAL CLINICAL IMPRESSION(S) / ED DIAGNOSES   Final diagnoses:  Palpitations  Shortness of breath     Rx / DC Orders   ED Discharge Orders     None        Note:  This document was prepared using Dragon voice recognition software and may include unintentional dictation errors.   Lavonia Drafts, MD 12/13/21 2110

## 2021-12-18 ENCOUNTER — Emergency Department
Admission: EM | Admit: 2021-12-18 | Discharge: 2021-12-18 | Disposition: A | Discharge status: Home / Self Care | Payer: Medicare (Managed Care) | Attending: Emergency Medicine | Admitting: Emergency Medicine

## 2021-12-18 ENCOUNTER — Encounter: Payer: Self-pay | Admitting: Emergency Medicine

## 2021-12-18 ENCOUNTER — Other Ambulatory Visit: Payer: Self-pay

## 2021-12-18 DIAGNOSIS — E860 Dehydration: Secondary | ICD-10-CM | POA: Diagnosis present

## 2021-12-18 DIAGNOSIS — I10 Essential (primary) hypertension: Secondary | ICD-10-CM | POA: Diagnosis not present

## 2021-12-18 DIAGNOSIS — E876 Hypokalemia: Secondary | ICD-10-CM

## 2021-12-18 DIAGNOSIS — Z20822 Contact with and (suspected) exposure to covid-19: Secondary | ICD-10-CM | POA: Insufficient documentation

## 2021-12-18 LAB — URINALYSIS, COMPLETE (UACMP) WITH MICROSCOPIC
Bacteria, UA: NONE SEEN
Bilirubin Urine: NEGATIVE
Glucose, UA: NEGATIVE mg/dL
Hgb urine dipstick: NEGATIVE
Ketones, ur: 5 mg/dL — AB
Nitrite: NEGATIVE
Protein, ur: 100 mg/dL — AB
Specific Gravity, Urine: 1.027 (ref 1.005–1.030)
pH: 5 (ref 5.0–8.0)

## 2021-12-18 LAB — BASIC METABOLIC PANEL
Anion gap: 7 (ref 5–15)
BUN: 11 mg/dL (ref 8–23)
CO2: 23 mmol/L (ref 22–32)
Calcium: 8.8 mg/dL — ABNORMAL LOW (ref 8.9–10.3)
Chloride: 105 mmol/L (ref 98–111)
Creatinine, Ser: 0.7 mg/dL (ref 0.44–1.00)
GFR, Estimated: >60 mL/min (ref >60)
Glucose, Bld: 91 mg/dL (ref 70–99)
Potassium: 3.1 mmol/L — ABNORMAL LOW (ref 3.5–5.1)
Sodium: 135 mmol/L (ref 135–145)

## 2021-12-18 LAB — CBC
HCT: 37.5 % (ref 36.0–46.0)
Hemoglobin: 11.7 g/dL — ABNORMAL LOW (ref 12.0–15.0)
MCH: 25.6 pg — ABNORMAL LOW (ref 26.0–34.0)
MCHC: 31.2 g/dL (ref 30.0–36.0)
MCV: 82.1 fL (ref 80.0–100.0)
Platelets: 276 10*3/uL (ref 150–400)
RBC: 4.57 MIL/uL (ref 3.87–5.11)
RDW: 14.4 % (ref 11.5–15.5)
WBC: 5.3 10*3/uL (ref 4.0–10.5)
nRBC: 0 % (ref 0.0–0.2)

## 2021-12-18 LAB — RESP PANEL BY RT-PCR (FLU A&B, COVID) ARPGX2
Influenza A by PCR: NEGATIVE
Influenza B by PCR: NEGATIVE
SARS Coronavirus 2 by RT PCR: NEGATIVE

## 2021-12-18 MED ORDER — POTASSIUM CHLORIDE 10 MEQ/100ML IV SOLN
10.0000 meq | Freq: Once | INTRAVENOUS | Status: AC
  Administered 2021-12-18: 10 meq via INTRAVENOUS
  Filled 2021-12-18: qty 100

## 2021-12-18 MED ORDER — SODIUM CHLORIDE 0.9 % IV BOLUS
1000.0000 mL | Freq: Once | INTRAVENOUS | Status: AC
  Administered 2021-12-18: 1000 mL via INTRAVENOUS

## 2021-12-18 MED ORDER — POTASSIUM CHLORIDE CRYS ER 20 MEQ PO TBCR: 40.0000 meq | EXTENDED_RELEASE_TABLET | Freq: Once | ORAL | Status: DC

## 2021-12-18 MED ORDER — ONDANSETRON HCL 4 MG/2ML IJ SOLN
4.0000 mg | Freq: Once | INTRAMUSCULAR | Status: AC
  Administered 2021-12-18: 4 mg via INTRAVENOUS
  Filled 2021-12-18: qty 2

## 2021-12-18 NOTE — ED Triage Notes (Signed)
Pt via POV from home. Pt c/o dehydration and fatigue for the past 2 days. Mucus membranes are pink and moist. Pt states urine is dark. Pt is A&OX4 and NAD. Denies pain.

## 2021-12-18 NOTE — ED Notes (Signed)
This Rn attempted IV twice. Had AM come attempt.

## 2021-12-18 NOTE — ED Notes (Signed)
RN to bedside to introduce self to pt and initiate care. Pt advised she has been having heart palpitations for a week. Was seen here Monday for same. Unsure what her diagnosis was for Monday.

## 2021-12-18 NOTE — ED Provider Notes (Signed)
[facility] Emergency Department Provider Note     Event Date/Time   First MD Initiated Contact with Patient 12/18/21 1728     (approximate)   History   Dehydration   HPI  Denise Schaefer is a 61 y.o. female with a history of hypertension, GERD, Crohn's disease, presents to the ED for evaluation of fatigue and dehydration.  Patient reports symptoms for the last 2 days.  She reports feeling "dry" all over. She notes dry mouth and dry skin. She reports she feels she is unable to drink and swallow due to extreme mouth dryness. She denies any sore throat, chest pain, cough, congestion, nausea, vomiting, diarrhea.  She does report that her urine is dark but denies any dysuria, hematuria, urinary retention.     Physical Exam   Triage Vital Signs: ED Triage Vitals  Enc Vitals Group     BP 12/18/21 1606 (!) 135/97     Pulse Rate 12/18/21 1606 73     Resp 12/18/21 1606 18     Temp 12/18/21 1606 98.1 F (36.7 C)     Temp Source 12/18/21 1606 Oral     SpO2 12/18/21 1606 100 %     Weight 12/18/21 1607 197 lb (89.4 kg)     Height 12/18/21 1607 5\' 7"  (1.702 m)     Head Circumference --      Peak Flow --      Pain Score 12/18/21 1607 0     Pain Loc --      Pain Edu? --      Excl. in [place]? --     Most recent vital signs: Vitals:   12/18/21 1813 12/18/21 1847  BP: 132/85   Pulse:  75  Resp: 13 12  Temp: 98 F (36.7 C)   SpO2: 98% 98%    General Awake, no distress. anxious HEENT NCAT. PERRL. EOMI. No rhinorrhea. Mucous membranes are moist. CV:  Good peripheral perfusion. RRR RESP:  Normal effort.  ABD:  No distention.    ED Results / Procedures / Treatments   Labs (all labs ordered are listed, but only abnormal results are displayed) Labs Reviewed  CBC - Abnormal; Notable for the following components:      Result Value   Hemoglobin 11.7 (*)    MCH 25.6 (*)    All other components within normal limits  BASIC METABOLIC PANEL - Abnormal;  Notable for the following components:   Potassium 3.1 (*)    Calcium 8.8 (*)    All other components within normal limits  URINALYSIS, COMPLETE (UACMP) WITH MICROSCOPIC - Abnormal; Notable for the following components:   Color, Urine YELLOW (*)    APPearance HAZY (*)    Ketones, ur 5 (*)    Protein, ur 100 (*)    Leukocytes,Ua TRACE (*)    All other components within normal limits  RESP PANEL BY RT-PCR (FLU A&B, COVID) [id number]     EKG  See EKG report  RADIOLOGY   No results found.   PROCEDURES:  Critical Care performed: No  Procedures   MEDICATIONS ORDERED IN ED: Medications  sodium chloride 0.9 % bolus 1,000 mL (1,000 mLs Intravenous New Bag/Given 12/18/21 1838)  ondansetron [facility]) injection 4 mg (4 mg Intravenous Given 12/18/21 1838)  potassium chloride 10 mEq in 100 mL IVPB ( Intravenous Restarted 12/18/21 1837)     IMPRESSION / MDM / ASSESSMENT AND PLAN / ED COURSE  I reviewed the triage vital signs  and the nursing notes.                              Differential diagnosis includes, but is not limited to, dehydration, viral illness, electrolyte abnormality,   The patient is on the cardiac monitor to evaluate for evidence of arrhythmia and/or significant heart rate changes.  Patient with ED evaluation of 2 days of weakness and poor oral intake. She was evaluated for her symptoms and found to have an overall normal work-up. CBC without leukocytosis or critical anemia. Her BMP does show a mild hypokalemia at 3.1. Viral panel is negative, and cardiac monitoring is without malignant arrhythmia or ST changes. Patient's potassium is supplemented with an IV dose. Patient is reporting significant improvement in her symptoms after fluid bolus, on interim exam. Patient's diagnosis is consistent with hypokalemia.   Patient final diagnosis and disposition will be transferred to my attending physician, Cherylann Banas, MD.    FINAL CLINICAL IMPRESSION(S) / ED DIAGNOSES   Final  diagnoses:  Hypokalemia     Rx / DC Orders   ED Discharge Orders     None        Note:  This document was prepared using Dragon voice recognition software and may include unintentional dictation errors.    Melvenia Needles, PA-C 12/18/21 2012    Arta Silence, MD 12/18/21 2114

## 2022-01-27 DIAGNOSIS — G4733 Obstructive sleep apnea (adult) (pediatric): Secondary | ICD-10-CM | POA: Insufficient documentation

## 2022-09-05 NOTE — Progress Notes (Signed)
[facility] [address] [place], [place] [address]  Pulmonary Sleep Medicine   Office Visit Note  Patient Name: Denise Schaefer DOB: 01/11/1961 MRN [id number]    Chief Complaint: Obstructive Sleep Apnea visit  Brief History:  Denise Schaefer is seen today for an initial consult for CPAP@ 7 cmH2O. The patient has a 1.5 year history of sleep apnea. Prior to the use of CPAP patient  states she had a year of gasping and panic attacks  when she slept  and waking tired in the morning. During the day she reports she was excessively sleepy and tired. Her family reported loud snoring and witnessed apnea. Patient is using PAP nightly.  The patient feels rested after sleeping with PAP.  The patient reports benefiting from PAP use. Reported sleepiness is  improved and the Epworth Sleepiness Score is 0 out of 24. The patient does not take naps. The patient complains of the following: none. She reports she prefers nasal pillow mask and is going to continue with this. The compliance download shows 98% compliance with an average use time of 6 hours 58 minutes. The AHI is 3.1.  The patient does not complain of limb movements disrupting sleep. The patient continues to require PAP therapy in order to eliminate her sleep apnea.   ROS  General: (-) fever, (-) chills, (-) night sweat Nose and Sinuses: (-) nasal stuffiness or itchiness, (-) postnasal drip, (-) nosebleeds, (-) sinus trouble. Mouth and Throat: (-) sore throat, (-) hoarseness. Neck: (-) swollen glands, (-) enlarged thyroid, (-) neck pain. Respiratory: - cough, - shortness of breath, - wheezing. Neurologic: + numbness, + tingling. Psychiatric: + anxiety, + depression   Current Medication: Outpatient Encounter Medications as of 09/06/2022  Medication Sig   mesalamine (ROWASA) 4 g enema Place rectally.   amLODipine (NORVASC) 10 MG tablet Take 10 mg by mouth.   azelastine (ASTELIN) 0.1 % nasal spray Place 1 spray into both nostrils as  needed. Use in each nostril as directed    Biotin 2500 MCG CAPS Take by mouth.   Dexlansoprazole 30 MG capsule DR Take by mouth.   famotidine (PEPCID) 20 MG tablet Take 20 mg by mouth 2 (two) times daily.   fluticasone (FLONASE) 50 MCG/ACT nasal spray Place into the nose.   folic acid (FOLVITE) 1 MG tablet    hydrocortisone (CORTENEMA) 100 MG/60ML enema    hydrocortisone (PROCTOZONE-HC) 2.5 % rectal cream APPLY RECATLLY TWICE DAILY AS NEEDED   levocetirizine (XYZAL) 5 MG tablet Take 5 mg by mouth every evening.   polyethylene glycol (MIRALAX / GLYCOLAX) packet Take 17 g by mouth daily as needed.    Vitamin E (VITAMIN E/D-ALPHA NATURAL) 268 MG (400 UNIT) CAPS Take by mouth.   [DISCONTINUED] gabapentin (NEURONTIN) 300 MG capsule Take 1 capsule (300 mg total) by mouth 3 (three) times daily.   [DISCONTINUED] mirtazapine (REMERON) 7.5 MG tablet Take 7.5 mg by mouth at bedtime.   [DISCONTINUED] pantoprazole (PROTONIX) 40 MG tablet Take 40 mg by mouth 2 (two) times daily.   No facility-administered encounter medications on file as of 09/06/2022.    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   Crohn's disease (HCC)    GERD (gastroesophageal reflux disease)    Hypertension age 1   Tachycardia, unspecified    after cervical fusion   Umbilical hernia     Family History: Non contributory to the present illness  Social History: Social History   Socioeconomic History   Marital status: Single    Spouse name: Not on file   Number of children: Not on file   Years of education: Not on file   Highest education level: Not on file  Occupational History   Not on file  Tobacco Use   Smoking status: Former     Packs/day: 1.50    Years: 15.00    Total pack years: 22.50    Types: Cigarettes    Quit date: 12/01/2011    Years since quitting: 10.7   Smokeless tobacco: Never  Vaping Use   Vaping Use: Some days  Substance and Sexual Activity   Alcohol use: No   Drug use: No   Sexual activity: Not on file  Other Topics Concern   Not on file  Social History Narrative   Not on file   Social Determinants of Health   Financial Resource Strain: Not on file  Food Insecurity: Not on file  Transportation Needs: Not on file  Physical Activity: Not on file  Stress: Not on file  Social Connections: Not on file  Intimate Partner Violence: Not on file    Vital Signs: Blood pressure (!) 142/90, pulse 77, resp. rate 12, height 5\' 7"  (1.702 m), weight 194 lb 8 oz (88.2 kg), SpO2 98 %. Body mass index is 30.46 kg/m.    Examination: General Appearance: The patient is well-developed, well-nourished, and in no distress. Neck Circumference: 35cm Skin: Gross inspection of skin unremarkable. Head: normocephalic, no gross deformities. Eyes: no gross deformities noted. ENT: ears appear grossly normal Neurologic: Alert and oriented. No involuntary movements.  STOP BANG RISK ASSESSMENT S (snore) Have you been told that you snore?     No   T (tired) Are you often tired, fatigued, or sleepy during the day?   YES  O (obstruction) Do you stop breathing, choke, or gasp during sleep? NO   P (pressure) Do you have or are you being treated for high blood pressure? YES   B (BMI) Is your body index greater than 35 kg/m? NO   A (age) Are you 63 years old or older? YES   N (neck) Do you have a neck circumference greater than 16 inches?   NO   G (gender) Are you a female? NO   TOTAL STOP/BANG "YES" ANSWERS 3       A STOP-Bang score of 2 or less is considered low risk, and a score of 5 or more is high risk for having either moderate or severe OSA. For people who score 3 or 4, doctors may need to perform  further assessment to determine how likely they are to have OSA.         EPWORTH SLEEPINESS SCALE:  Scale:  (0)= no chance of dozing; (1)= slight chance of dozing; (2)= moderate chance of dozing; (3)= high chance of dozing  Chance  Situtation    Sitting and reading: 0    Watching TV: 0    Sitting Inactive in public: 0    As a passenger in car: 0      Lying down to rest: 1    Sitting and talking: 0    Sitting quielty  after lunch: 2    In a car, stopped in traffic: 0   TOTAL SCORE:   3 out of 24    SLEEP STUDIES:  PSG (12/2020 at [facility]) AHI 9/hr, min SpO2 85% Titration (01/2021 at [facility]) CPAP@ 7 cmH2O, EPR 1   CPAP COMPLIANCE DATA:  Date Range: 06/07/2022-09/04/2022  Average Daily Use: 6 hours 58 minutes  Median Use: 6 hours 57 minutes  Compliance for > 4 Hours: 98%  AHI: 3.1 respiratory events per hour  Days Used: 90/90 days  Mask Leak: 10.4  95th Percentile Pressure: 7         LABS: No results found for this or any previous visit (from the past 2160 hour(s)).  Radiology: No results found.  No results found.  No results found.    Assessment and Plan: Patient Active Problem List   Diagnosis Date Noted   OSA (obstructive sleep apnea) 01/27/2022   Obesity (BMI 30-39.9) 10/05/2021   Right groin pain 05/26/2017   Right lower quadrant pain 04/19/2017   Hot flashes 04/19/2017   Vaginal dryness 04/19/2017   Crohn's disease of both small and large intestine with intestinal obstruction ([place]) 32/99/2426   Umbilical hernia 83/41/9622   Benign essential HTN 05/31/2011      The patient does tolerate PAP and reports benefit from PAP use. The patient was reminded how to adjust mask fit and advised to  change supplies regularly. The patient was also counselled on nightly use. The compliance is excellent. The AHI is 3.1. Pt continues to require cpap to treat her osa and is medically necessary.   1. OSA (obstructive sleep apnea) Continue excellent  compliance  2. CPAP use counseling CPAP couseling-Discussed importance of adequate CPAP use as well as proper care and cleaning techniques of machine and all supplies.  3. Benign essential HTN Continue current medication and f/u with PCP.  4. Gastroesophageal reflux disease, unspecified whether esophagitis present Continue current medication and f/u with PCP.  5. Obesity (BMI 30-39.9) Obesity Counseling: Had a lengthy discussion regarding patients BMI and weight issues. Patient was instructed on portion control as well as increased activity. Also discussed caloric restrictions with trying to maintain intake less than 2000 Kcal. Discussions were made in accordance with the 5As of weight management. Simple actions such as not eating late and if able to, taking a walk is suggested.    General Counseling: I have discussed the findings of the evaluation and examination with Marlowe Kays.  I have also discussed any further diagnostic evaluation thatmay be needed or ordered today. Kiaira verbalizes understanding of the findings of todays visit. We also reviewed her medications today and discussed drug interactions and side effects including but not limited excessive drowsiness and altered mental states. We also discussed that there is always a risk not just to her but also people around her. she has been encouraged to call the office with any questions or concerns that should arise related to todays visit.  No orders of the defined types were placed in this encounter.       I have personally obtained a history, examined the patient, evaluated laboratory and imaging results, formulated the assessment and plan and placed orders.  This patient was seen by Drema Dallas, PA-C in collaboration with Dr. Devona Konig as a part of collaborative care agreement.  Allyne Gee, MD [facility] Diplomate ABMS Pulmonary Critical Care Medicine and Sleep Medicine

## 2022-09-06 ENCOUNTER — Ambulatory Visit (INDEPENDENT_AMBULATORY_CARE_PROVIDER_SITE_OTHER): Payer: Medicare (Managed Care) | Admitting: Internal Medicine

## 2022-09-06 VITALS — BP 142/90 | HR 77 | Resp 12 | Ht 67.0 in | Wt 194.5 lb

## 2022-09-06 DIAGNOSIS — K219 Gastro-esophageal reflux disease without esophagitis: Secondary | ICD-10-CM

## 2022-09-06 DIAGNOSIS — I10 Essential (primary) hypertension: Secondary | ICD-10-CM | POA: Diagnosis not present

## 2022-09-06 DIAGNOSIS — G4733 Obstructive sleep apnea (adult) (pediatric): Secondary | ICD-10-CM

## 2022-09-06 DIAGNOSIS — Z7189 Other specified counseling: Secondary | ICD-10-CM

## 2022-09-06 DIAGNOSIS — E669 Obesity, unspecified: Secondary | ICD-10-CM

## 2022-09-06 NOTE — Patient Instructions (Signed)

## 2023-09-04 NOTE — Progress Notes (Unsigned)
[facility] [address] [place], [place] [address]  Pulmonary Sleep Medicine   Office Visit Note  Patient Name: Denise Schaefer DOB: 12/20/1960 MRN [id number]    Chief Complaint: Obstructive Sleep Apnea visit  Brief History:  Gearline is seen today for an annual follow up visit for CPAP@ 7 cmH2O. The patient has a 2.5 year history of sleep apnea. Patient is not using PAP nightly.  The patient feels *** after sleeping with PAP.  The patient reports *** from PAP use. Reported sleepiness is  *** and the Epworth Sleepiness Score is *** out of 24. The patient *** take naps. The patient complains of the following: ***  The compliance download shows 54% compliance with an average use time of 6 hours 1 minute. The AHI is 3.0.  The patient *** of limb movements disrupting sleep. The patient continues to require PAP therapy in order to eliminate sleep apnea.   ROS  General: (-) fever, (-) chills, (-) night sweat Nose and Sinuses: (-) nasal stuffiness or itchiness, (-) postnasal drip, (-) nosebleeds, (-) sinus trouble. Mouth and Throat: (-) sore throat, (-) hoarseness. Neck: (-) swollen glands, (-) enlarged thyroid, (-) neck pain. Respiratory: *** cough, *** shortness of breath, *** wheezing. Neurologic: *** numbness, *** tingling. Psychiatric: *** anxiety, *** depression   Current Medication: Outpatient Encounter Medications as of 09/05/2023  Medication Sig   amLODipine (NORVASC) 10 MG tablet Take 10 mg by mouth.   azelastine (ASTELIN) 0.1 % nasal spray Place 1 spray into both nostrils as needed. Use in each nostril as directed    Biotin 2500 MCG CAPS Take by mouth.   Dexlansoprazole 30 MG capsule DR Take by mouth.   famotidine (PEPCID) 20 MG tablet Take 20 mg by mouth 2 (two) times daily.   fluticasone (FLONASE) 50 MCG/ACT nasal spray Place into the nose.   folic acid (FOLVITE) 1 MG tablet    hydrocortisone (CORTENEMA) 100 MG/60ML enema    hydrocortisone (PROCTOZONE-HC) 2.5  % rectal cream APPLY RECATLLY TWICE DAILY AS NEEDED   levocetirizine (XYZAL) 5 MG tablet Take 5 mg by mouth every evening.   mesalamine (ROWASA) 4 g enema Place rectally.   polyethylene glycol (MIRALAX / GLYCOLAX) packet Take 17 g by mouth daily as needed.    Vitamin E (VITAMIN E/D-ALPHA NATURAL) 268 MG (400 UNIT) CAPS Take by mouth.   No facility-administered encounter medications on file as of 09/05/2023.    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   Crohn's disease (HCC)    GERD (gastroesophageal reflux disease)    Hypertension age 69   Tachycardia, unspecified    after cervical fusion   Umbilical hernia     Family History: Non contributory to the present illness  Social History: Social History   Socioeconomic History   Marital status: Single    Spouse name: Not on file   Number of children: Not on file   Years of education: Not on file   Highest education level: Not on file  Occupational History   Not on file  Tobacco Use   Smoking status: Former    Current packs/day: 0.00  Average packs/day: 1.5 packs/day for 15.0 years (22.5 ttl pk-yrs)    Types: Cigarettes    Start date: 11/30/1996    Quit date: 12/01/2011    Years since quitting: 11.7   Smokeless tobacco: Never  Vaping Use   Vaping status: Some Days  Substance and Sexual Activity   Alcohol use: No   Drug use: No   Sexual activity: Not on file  Other Topics Concern   Not on file  Social History Narrative   Not on file   Social Determinants of Health   Financial Resource Strain: Medium Risk (03/17/2022)   Received from [facility]   Overall Financial Resource Strain (CARDIA)    Difficulty of Paying Living  Expenses: Somewhat hard  Food Insecurity: No Food Insecurity (03/17/2022)   Received from [facility]   Hunger Vital Sign    Worried About Running Out of Food in the Last Year: Never true    Ran Out of Food in the Last Year: Never true  Transportation Needs: No Transportation Needs (03/17/2022)   Received from [facility]   PRAPARE - Transportation    Lack of Transportation (Medical): No    Lack of Transportation (Non-Medical): No  Physical Activity: Inactive (03/17/2022)   Received from [facility]   Exercise Vital Sign    Days of Exercise per Week: 0 days    Minutes of Exercise per Session: 0 min  Stress: No Stress Concern Present (03/17/2022)   Received from [facility] - Occupational Stress Questionnaire    Feeling of Stress : Only a little  Social Connections: Moderately Isolated (03/17/2022)   Received from [facility]   Social Connection and Isolation Panel [NHANES]    Frequency of Communication with Friends and Family: Twice a week    Frequency of Social Gatherings with Friends and Family: Twice a week    Attends Religious Services: More than 4 times per year    Active Member of [employer] or Organizations: No    Attends Banker Meetings: Never    Marital Status: Never married  Intimate Partner Violence: Not At Risk (03/17/2022)   Received from [facility]   Humiliation, Afraid, Rape, and Kick questionnaire    Fear of Current or Ex-Partner: No    Emotionally Abused: No    Physically Abused: No    Sexually Abused: No    Vital Signs: There were no vitals taken for this visit. There is no height or weight on file to calculate BMI.    Examination: General Appearance: The patient is well-developed, well-nourished, and in no distress. Neck Circumference: 35 cm Skin: Gross inspection of skin unremarkable. Head: normocephalic, no gross deformities. Eyes: no gross deformities noted. ENT: ears appear  grossly normal Neurologic: Alert and oriented. No involuntary movements.  STOP BANG RISK ASSESSMENT S (snore) Have you been told that you snore?     YES/NO   T (tired) Are you often tired, fatigued, or sleepy during the day?   YES/NO  O (obstruction) Do you stop breathing, choke, or gasp during sleep? YES/NO   P (pressure) Do you have or are you being treated for high blood pressure? YES   B (BMI) Is your body index greater than 35 kg/m? YES   A (age) Are you 72 years old or older? YES   N (neck) Do you have a neck circumference greater than 16 inches?   NO   G (  gender) Are you a female? NO   TOTAL STOP/BANG "YES" ANSWERS        A STOP-Bang score of 2 or less is considered low risk, and a score of 5 or more is high risk for having either moderate or severe OSA. For people who score 3 or 4, doctors may need to perform further assessment to determine how likely they are to have OSA.         EPWORTH SLEEPINESS SCALE:  Scale:  (0)= no chance of dozing; (1)= slight chance of dozing; (2)= moderate chance of dozing; (3)= high chance of dozing  Chance  Situtation    Sitting and reading: ***    Watching TV: ***    Sitting Inactive in public: ***    As a passenger in car: ***      Lying down to rest: ***    Sitting and talking: ***    Sitting quielty after lunch: ***    In a car, stopped in traffic: ***   TOTAL SCORE:   *** out of 24    SLEEP STUDIES:  PSG (12/2020 at [facility]) AHI 9/hr, min SpO2 85% Titration (01/2021 at [facility]) CPAP@ 7 cmH2O, EPR 1   CPAP COMPLIANCE DATA:  Date Range: 08/14/2022-08/13/2023  Average Daily Use: 6 hours 1 minute  Median Use: 6 hours   Compliance for > 4 Hours: 54%  AHI: 3.0 respiratory events per hour  Days Used: 226/365 days  Mask Leak: 10.2  95th Percentile Pressure: 7         LABS: No results found for this or any previous visit (from the past 2160 hour(s)).  Radiology: No results found.  No results  found.  No results found.    Assessment and Plan: Patient Active Problem List   Diagnosis Date Noted   OSA (obstructive sleep apnea) 01/27/2022   Obesity (BMI 30-39.9) 10/05/2021   Right groin pain 05/26/2017   Right lower quadrant pain 04/19/2017   Hot flashes 04/19/2017   Vaginal dryness 04/19/2017   Crohn's disease of both small and large intestine with intestinal obstruction (HCC) 11/06/2014   Umbilical hernia 03/20/2013   Benign essential HTN 05/31/2011      The patient *** tolerate PAP and reports *** benefit from PAP use. The patient was reminded how to *** and advised to ***. The patient was also counselled on ***. The compliance is ***. The AHI is ***.   ***  General Counseling: I have discussed the findings of the evaluation and examination with Junious Dresser.  I have also discussed any further diagnostic evaluation thatmay be needed or ordered today. Allissa verbalizes understanding of the findings of todays visit. We also reviewed her medications today and discussed drug interactions and side effects including but not limited excessive drowsiness and altered mental states. We also discussed that there is always a risk not just to her but also people around her. she has been encouraged to call the office with any questions or concerns that should arise related to todays visit.  No orders of the defined types were placed in this encounter.       I have personally obtained a history, examined the patient, evaluated laboratory and imaging results, formulated the assessment and plan and placed orders.  Yevonne Pax, MD [facility] Diplomate ABMS Pulmonary Critical Care Medicine and Sleep Medicine

## 2023-09-05 ENCOUNTER — Ambulatory Visit: Payer: Medicare (Managed Care) | Admitting: Internal Medicine

## 2023-09-05 DIAGNOSIS — Z91199 Patient's noncompliance with other medical treatment and regimen due to unspecified reason: Secondary | ICD-10-CM

## 2023-09-25 ENCOUNTER — Emergency Department
Admission: EM | Admit: 2023-09-25 | Discharge: 2023-09-25 | Disposition: A | Discharge status: Home / Self Care | Payer: 59 | Attending: Student in an Organized Health Care Education/Training Program | Admitting: Student in an Organized Health Care Education/Training Program

## 2023-09-25 ENCOUNTER — Other Ambulatory Visit: Payer: Self-pay

## 2023-09-25 ENCOUNTER — Encounter: Payer: Self-pay | Admitting: Emergency Medicine

## 2023-09-25 DIAGNOSIS — Z8616 Personal history of COVID-19: Secondary | ICD-10-CM | POA: Insufficient documentation

## 2023-09-25 DIAGNOSIS — K0510 Chronic gingivitis, plaque induced: Secondary | ICD-10-CM | POA: Insufficient documentation

## 2023-09-25 DIAGNOSIS — I10 Essential (primary) hypertension: Secondary | ICD-10-CM | POA: Diagnosis not present

## 2023-09-25 DIAGNOSIS — J111 Influenza due to unidentified influenza virus with other respiratory manifestations: Secondary | ICD-10-CM | POA: Diagnosis not present

## 2023-09-25 DIAGNOSIS — K1379 Other lesions of oral mucosa: Secondary | ICD-10-CM | POA: Diagnosis present

## 2023-09-25 LAB — CBC WITH DIFFERENTIAL/PLATELET
Abs Immature Granulocytes: 0.01 10*3/uL (ref 0.00–0.07)
Basophils Absolute: 0 10*3/uL (ref 0.0–0.1)
Basophils Relative: 0 %
Eosinophils Absolute: 0.2 10*3/uL (ref 0.0–0.5)
Eosinophils Relative: 3 %
HCT: 38.6 % (ref 36.0–46.0)
Hemoglobin: 12.5 g/dL (ref 12.0–15.0)
Immature Granulocytes: 0 %
Lymphocytes Relative: 32 %
Lymphs Abs: 2.3 10*3/uL (ref 0.7–4.0)
MCH: 26.3 pg (ref 26.0–34.0)
MCHC: 32.4 g/dL (ref 30.0–36.0)
MCV: 81.1 fL (ref 80.0–100.0)
Monocytes Absolute: 0.8 10*3/uL (ref 0.1–1.0)
Monocytes Relative: 11 %
Neutro Abs: 3.8 10*3/uL (ref 1.7–7.7)
Neutrophils Relative %: 54 %
Platelets: 289 10*3/uL (ref 150–400)
RBC: 4.76 MIL/uL (ref 3.87–5.11)
RDW: 14 % (ref 11.5–15.5)
WBC: 7 10*3/uL (ref 4.0–10.5)
nRBC: 0 % (ref 0.0–0.2)

## 2023-09-25 LAB — BASIC METABOLIC PANEL
Anion gap: 10 (ref 5–15)
BUN: 10 mg/dL (ref 8–23)
CO2: 20 mmol/L — ABNORMAL LOW (ref 22–32)
Calcium: 8.8 mg/dL — ABNORMAL LOW (ref 8.9–10.3)
Chloride: 105 mmol/L (ref 98–111)
Creatinine, Ser: 0.63 mg/dL (ref 0.44–1.00)
GFR, Estimated: >60 mL/min (ref >60)
Glucose, Bld: 105 mg/dL — ABNORMAL HIGH (ref 70–99)
Potassium: 3.4 mmol/L — ABNORMAL LOW (ref 3.5–5.1)
Sodium: 135 mmol/L (ref 135–145)

## 2023-09-25 MED ORDER — CLINDAMYCIN HCL 300 MG PO CAPS: 300.0000 mg | ORAL_CAPSULE | Freq: Three times a day (TID) | ORAL | 0 refills | Status: AC

## 2023-09-25 NOTE — ED Triage Notes (Signed)
Patient to ED via POV for flu like symptoms. PT states she was dx with flu and canker sores on Friday. Given medications for same but states she is not feeling better. Unable to eat or drink due to pain from canker sores. States she is feeling tired.

## 2023-09-25 NOTE — ED Provider Notes (Signed)
[facility] Provider Note    Event Date/Time   First MD Initiated Contact with Patient 09/25/23 1550     (approximate)   History   Influenza   HPI  Denise Schaefer is a 62 y.o. female with a past medical history of Crohn's disease, hypertension, obesity who presents today for continued mouth pain and feeling tired.  Patient reports that she went to urgent care 3 days ago and had COVID, flu, RSV, strep testing and was positive for the flu.  Patient reports that her gums feel swollen and this is causing her to eat and drink less because it is uncomfortable.  She reports that she went to urgent care and was prescribed Magic mouthwash, Tamiflu, nystatin rinse which she has been using.  She denies any voice change or drooling.  She denies chest pain, shortness breath, abdominal pain, nausea, vomiting, diarrhea.  She has not had any fevers or chills.  Patient Active Problem List   Diagnosis Date Noted   OSA (obstructive sleep apnea) 01/27/2022   Obesity (BMI 30-39.9) 10/05/2021   Right groin pain 05/26/2017   Right lower quadrant pain 04/19/2017   Hot flashes 04/19/2017   Vaginal dryness 04/19/2017   Crohn's disease of both small and large intestine with intestinal obstruction (HCC) 11/06/2014   Umbilical hernia 03/20/2013   Benign essential HTN 05/31/2011          Physical Exam   Triage Vital Signs: ED Triage Vitals  Encounter Vitals Group     BP 09/25/23 1432 119/89     Systolic BP Percentile --      Diastolic BP Percentile --      Pulse Rate 09/25/23 1431 84     Resp 09/25/23 1431 18     Temp 09/25/23 1431 98.8 F (37.1 C)     Temp Source 09/25/23 1431 Oral     SpO2 09/25/23 1431 95 %     Weight 09/25/23 1431 190 lb (86.2 kg)     Height 09/25/23 1431 5\' 7"  (1.702 m)     Head Circumference --      Peak Flow --      Pain Score 09/25/23 1431 10     Pain Loc --      Pain Education --      Exclude from Growth Chart --     Most recent vital  signs: Vitals:   09/25/23 1431 09/25/23 1432  BP:  119/89  Pulse: 84   Resp: 18   Temp: 98.8 F (37.1 C)   SpO2: 95%     Physical Exam Vitals and nursing note reviewed.  Constitutional:      General: Awake and alert. No acute distress.    Appearance: Normal appearance. The patient is normal weight.  HENT:     Head: Normocephalic and atraumatic.     Mouth: Mucous membranes are moist.  Inflamed gingiva without evidence of ulceration or focal area of fluctuance.  No teeth tenderness.  No sublingual swelling or fluctuance.  Normal voice.  No drooling.  No ulcerations noted. Eyes:     General: PERRL. Normal EOMs        Right eye: No discharge.        Left eye: No discharge.     Conjunctiva/sclera: Conjunctivae normal.  Cardiovascular:     Rate and Rhythm: Normal rate and regular rhythm.     Pulses: Normal pulses.     Heart sounds: Normal heart sounds Pulmonary:  Effort: Pulmonary effort is normal. No respiratory distress.     Breath sounds: Normal breath sounds.  Abdominal:     Abdomen is soft. There is no abdominal tenderness. No rebound or guarding. No distention. Musculoskeletal:        General: No swelling. Normal range of motion.     Cervical back: Normal range of motion and neck supple.  Skin:    General: Skin is warm and dry.     Capillary Refill: Capillary refill takes less than 2 seconds.     Findings: No rash.  Neurological:     Mental Status: The patient is awake and alert.      ED Results / Procedures / Treatments   Labs (all labs ordered are listed, but only abnormal results are displayed) Labs Reviewed  BASIC METABOLIC PANEL - Abnormal; Notable for the following components:      Result Value   Potassium 3.4 (*)    CO2 20 (*)    Glucose, Bld 105 (*)    Calcium 8.8 (*)    All other components within normal limits  CBC WITH DIFFERENTIAL/PLATELET     EKG     RADIOLOGY     PROCEDURES:  Critical Care performed:    Procedures   MEDICATIONS ORDERED IN ED: Medications - No data to display   IMPRESSION / MDM / ASSESSMENT AND PLAN / ED COURSE  I reviewed the triage vital signs and the nursing notes.   Differential diagnosis includes, but is not limited to, influenza, other viral etiology, aphthous ulcers, gingivitis.  Patient is awake and alert, hemodynamically stable and afebrile.  She has the medications that were prescribed for her by the urgent care and these include Magic mouthwash, nystatin rinse, Tamiflu, and Zofran.  There is no evidence of abscess on exam.  She has no intraoral swelling.  She has inflammation of her gingiva and she is requesting an antibiotic, therefore this was prescribed.  Labs obtained in triage are overall reassuring, she does not appear to be clinically or laboratorily dehydrated, she has a BUN to creatinine ratio that is not suggestive of prerenal dehydration.  We discussed symptomatic management, expected timeframe for improvement, and return precautions.  Patient understands and agrees with plan.  She was discharged in stable condition.   Patient's presentation is most consistent with acute complicated illness / injury requiring diagnostic workup.    FINAL CLINICAL IMPRESSION(S) / ED DIAGNOSES   Final diagnoses:  Influenza  Gingivitis     Rx / DC Orders   ED Discharge Orders          Ordered    clindamycin (CLEOCIN) 300 MG capsule  3 times daily        09/25/23 1559             Note:  This document was prepared using Dragon voice recognition software and may include unintentional dictation errors.   Keturah Shavers 09/25/23 1832    Minna Antis, MD 09/26/23 2227

## 2023-09-25 NOTE — ED Notes (Signed)
See triage note   States she was dx;'d with flu last Friday  Also has some canker sores  States not feeling any better

## 2023-09-25 NOTE — Discharge Instructions (Signed)
You may continue taking the medications as prescribed by the urgent care.  You may also take the antibiotic for your inflamed gums.  Please follow-up with your outpatient provider.  Please return for any new, worsening, or change in symptoms or other concerns.

## 2023-11-19 ENCOUNTER — Emergency Department
Admission: EM | Admit: 2023-11-19 | Discharge: 2023-11-19 | Disposition: A | Discharge status: Home / Self Care | Payer: 59 | Attending: Emergency Medicine | Admitting: Emergency Medicine

## 2023-11-19 ENCOUNTER — Emergency Department: Payer: 59

## 2023-11-19 ENCOUNTER — Other Ambulatory Visit: Payer: Self-pay

## 2023-11-19 DIAGNOSIS — Z79899 Other long term (current) drug therapy: Secondary | ICD-10-CM | POA: Insufficient documentation

## 2023-11-19 DIAGNOSIS — B349 Viral infection, unspecified: Secondary | ICD-10-CM | POA: Insufficient documentation

## 2023-11-19 DIAGNOSIS — I10 Essential (primary) hypertension: Secondary | ICD-10-CM | POA: Diagnosis not present

## 2023-11-19 DIAGNOSIS — K5090 Crohn's disease, unspecified, without complications: Secondary | ICD-10-CM | POA: Diagnosis not present

## 2023-11-19 DIAGNOSIS — R197 Diarrhea, unspecified: Secondary | ICD-10-CM | POA: Diagnosis not present

## 2023-11-19 DIAGNOSIS — R519 Headache, unspecified: Secondary | ICD-10-CM | POA: Diagnosis present

## 2023-11-19 DIAGNOSIS — Z20822 Contact with and (suspected) exposure to covid-19: Secondary | ICD-10-CM | POA: Insufficient documentation

## 2023-11-19 LAB — RESP PANEL BY RT-PCR (RSV, FLU A&B, COVID)  RVPGX2
Influenza A by PCR: NEGATIVE
Influenza B by PCR: NEGATIVE
Resp Syncytial Virus by PCR: NEGATIVE
SARS Coronavirus 2 by RT PCR: NEGATIVE

## 2023-11-19 MED ORDER — LOSARTAN POTASSIUM 25 MG PO TABS: 25.0000 mg | ORAL_TABLET | Freq: Every day | ORAL | 1 refills | Status: AC

## 2023-11-19 NOTE — Discharge Instructions (Addendum)
Keep your appointment with your primary care provider as scheduled.  For the next 24 hours stay on clear liquids such as ginger ale, Sprite, 7-Up, Gatorade, Jell-O and chicken broth without stars or noodles.  This may cause increased urination but should help with diarrhea.  Tylenol as needed for body aches or headache. Discontinue taking the hydrochlorothiazide which does not seem to be helping to control your blood pressure.

## 2023-11-19 NOTE — ED Triage Notes (Addendum)
Pt comes with c/o headache, cold chills, sick on stomach and diarrhea that started last Thursday. Pt also states her BP has been elevated. Pt is on meds for this. Pt states the new med they started her on makes her head hurt after she takes it. Pt did not take meds today. Pt states she is just tired

## 2023-11-19 NOTE — ED Provider Notes (Signed)
[facility] Provider Note    Event Date/Time   First MD Initiated Contact with Patient 11/19/23 [phone]     (approximate)   History   Diarrhea   HPI  Denise Schaefer is a 63 y.o. female   presents to the ED with complaint of headache, cold chills, nausea, diarrhea that started approximately 3 days ago.  Patient is also concerned as her blood pressure is elevated.  She currently is taking hydrochlorothiazide 12.5 mg daily.  She states that she discontinued taking the Norvasc as it was causing her gums to swell.  Patient reports that she has had a headache and fatigue.  She did not take her medications today.  She has an appointment with her PCP in approximately 3 weeks but states that the blood pressure medication is not helping.  Patient has a history of hypertension and Crohn's disease.      Physical Exam   Triage Vital Signs: ED Triage Vitals  Encounter Vitals Group     BP 11/19/23 0833 (!) 177/105     Systolic BP Percentile --      Diastolic BP Percentile --      Pulse Rate 11/19/23 0833 69     Resp 11/19/23 0833 18     Temp 11/19/23 0833 98 F (36.7 C)     Temp Source 11/19/23 0901 Oral     SpO2 11/19/23 0833 98 %     Weight 11/19/23 0833 198 lb (89.8 kg)     Height 11/19/23 0833 5\' 7"  (1.702 m)     Head Circumference --      Peak Flow --      Pain Score 11/19/23 0833 4     Pain Loc --      Pain Education --      Exclude from Growth Chart --     Most recent vital signs: Vitals:   11/19/23 1024 11/19/23 1036  BP: (!) 146/92 (!) 146/88  Pulse: (!) 57   Resp:    Temp:    SpO2:       General: Awake, no distress.  Alert, talkative, answers questions appropriately. CV:  Good peripheral perfusion.  Heart regular rate rhythm. Resp:  Normal effort.  Lungs are clear bilaterally. Abd:  No distention.  Soft, nontender, bowel sounds present x 4 quadrants. Other:     ED Results / Procedures / Treatments   Labs (all labs ordered are listed,  but only abnormal results are displayed) Labs Reviewed  RESP PANEL BY RT-PCR (RSV, FLU A&B, COVID)  [id number]    RADIOLOGY Head CT scan per radiologist is negative for acute intracranial changes.    PROCEDURES:  Critical Care performed:   Procedures   MEDICATIONS ORDERED IN ED: Medications - No data to display   IMPRESSION / MDM / ASSESSMENT AND PLAN / ED COURSE  I reviewed the triage vital signs and the nursing notes.   Differential diagnosis includes, but is not limited to, influenza, COVID, RSV, migraine, headache, migraine, considered space occupying lesion to the brain, hypertension uncontrolled.  63 year old female presents to the ED with complaint of headache, chills, diarrhea with some nausea.  Respiratory panel was reassuring and patient was made aware that was negative.  CT scan was discussed and patient was made aware of its findings.  We discussed at length her blood pressure issues.  Initial blood pressure in the emergency department was 177/105.  Most likely hydrochlorothiazide 12.5 mg will not be effective for controlling her  blood pressure.  A prescription for losartan 25 mg was sent to the pharmacy for her to begin taking.  She is strongly encouraged to keep her appointment with her PCP and continue monitoring her blood pressure at home.  I encouraged her to drink clear fluids for the next 24 hours to see if this helps with the diarrhea and that most likely this is viral.      Patient's presentation is most consistent with acute illness / injury with system symptoms.  FINAL CLINICAL IMPRESSION(S) / ED DIAGNOSES   Final diagnoses:  Viral illness  Elevated blood pressure reading with diagnosis of hypertension  Hypertension, uncontrolled     Rx / DC Orders   ED Discharge Orders          Ordered    losartan (COZAAR) 25 MG tablet  Daily        11/19/23 1018             Note:  This document was prepared using Dragon voice recognition software and may  include unintentional dictation errors.   Tommi Rumps, PA-C 11/19/23 1441    Minna Antis, MD 11/19/23 [phone]

## 2023-11-19 NOTE — ED Notes (Signed)
Warm blanket given to pt per request.

## 2023-12-25 ENCOUNTER — Ambulatory Visit: Payer: 59 | Admitting: General Practice

## 2024-02-27 ENCOUNTER — Emergency Department
Admission: EM | Admit: 2024-02-27 | Discharge: 2024-02-27 | Disposition: A | Discharge status: Home / Self Care | Attending: Emergency Medicine | Admitting: Emergency Medicine

## 2024-02-27 ENCOUNTER — Encounter: Payer: Self-pay | Admitting: Emergency Medicine

## 2024-02-27 ENCOUNTER — Other Ambulatory Visit: Payer: Self-pay

## 2024-02-27 DIAGNOSIS — R030 Elevated blood-pressure reading, without diagnosis of hypertension: Secondary | ICD-10-CM | POA: Diagnosis not present

## 2024-02-27 DIAGNOSIS — R531 Weakness: Secondary | ICD-10-CM | POA: Insufficient documentation

## 2024-02-27 DIAGNOSIS — R5383 Other fatigue: Secondary | ICD-10-CM | POA: Diagnosis not present

## 2024-02-27 DIAGNOSIS — R55 Syncope and collapse: Secondary | ICD-10-CM | POA: Diagnosis not present

## 2024-02-27 DIAGNOSIS — I10 Essential (primary) hypertension: Secondary | ICD-10-CM | POA: Insufficient documentation

## 2024-02-27 DIAGNOSIS — R0989 Other specified symptoms and signs involving the circulatory and respiratory systems: Secondary | ICD-10-CM

## 2024-02-27 DIAGNOSIS — Z79899 Other long term (current) drug therapy: Secondary | ICD-10-CM | POA: Diagnosis not present

## 2024-02-27 DIAGNOSIS — R42 Dizziness and giddiness: Secondary | ICD-10-CM | POA: Diagnosis not present

## 2024-02-27 LAB — BASIC METABOLIC PANEL WITH GFR
Anion gap: 8 (ref 5–15)
BUN: 20 mg/dL (ref 8–23)
CO2: 27 mmol/L (ref 22–32)
Calcium: 9.1 mg/dL (ref 8.9–10.3)
Chloride: 101 mmol/L (ref 98–111)
Creatinine, Ser: 0.95 mg/dL (ref 0.44–1.00)
GFR, Estimated: >60 mL/min (ref >60)
Glucose, Bld: 104 mg/dL — ABNORMAL HIGH (ref 70–99)
Potassium: 3.3 mmol/L — ABNORMAL LOW (ref 3.5–5.1)
Sodium: 136 mmol/L (ref 135–145)

## 2024-02-27 LAB — CBC
HCT: 38.2 % (ref 36.0–46.0)
Hemoglobin: 12.4 g/dL (ref 12.0–15.0)
MCH: 26.3 pg (ref 26.0–34.0)
MCHC: 32.5 g/dL (ref 30.0–36.0)
MCV: 81.1 fL (ref 80.0–100.0)
Platelets: 278 10*3/uL (ref 150–400)
RBC: 4.71 MIL/uL (ref 3.87–5.11)
RDW: 14 % (ref 11.5–15.5)
WBC: 5.9 10*3/uL (ref 4.0–10.5)
nRBC: 0 % (ref 0.0–0.2)

## 2024-02-27 LAB — URINALYSIS, ROUTINE W REFLEX MICROSCOPIC
Bilirubin Urine: NEGATIVE
Glucose, UA: NEGATIVE mg/dL
Hgb urine dipstick: NEGATIVE
Ketones, ur: NEGATIVE mg/dL
Leukocytes,Ua: NEGATIVE
Nitrite: NEGATIVE
Protein, ur: NEGATIVE mg/dL
Specific Gravity, Urine: 1.006 (ref 1.005–1.030)
pH: 7 (ref 5.0–8.0)

## 2024-02-27 LAB — TROPONIN I (HIGH SENSITIVITY): Troponin I (High Sensitivity): 4 ng/L (ref <18)

## 2024-02-27 LAB — MAGNESIUM: Magnesium: 2.2 mg/dL (ref 1.7–2.4)

## 2024-02-27 MED ORDER — POTASSIUM CHLORIDE CRYS ER 20 MEQ PO TBCR
40.0000 meq | EXTENDED_RELEASE_TABLET | Freq: Once | ORAL | Status: AC
  Administered 2024-02-27: 40 meq via ORAL
  Filled 2024-02-27: qty 2

## 2024-02-27 MED ORDER — SODIUM CHLORIDE 0.9 % IV BOLUS (SEPSIS)
1000.0000 mL | Freq: Once | INTRAVENOUS | Status: AC
  Administered 2024-02-27: 1000 mL via INTRAVENOUS

## 2024-02-27 NOTE — ED Notes (Addendum)
 Pt presented to ED with multiple complaints after having blood pressure medication changes over the past 2 months. Pt states that she was referred to a cardiologist for blood pressure management from PCP and has had a difficult time with medications she has been prescribed. Most recently blood pressure medication was changed 2-3 weeks ago, pt has been experiencing "staggering," lack of focus, ringing in bilateral ears, vision changes. States "all I can do is lay in the bed." Pt A&Ox4. Grip strength weaker on left side due to prior surgery, pt states that is her baseline. Lower extremity strength and sensation intact. No facial droop noted. Pt attached to cardiac monitor, bed in lowest position, call bell within reach.

## 2024-02-27 NOTE — ED Triage Notes (Signed)
 Patient ambulatory to triage with steady gait, without difficulty or distress noted; pt reports that she checked her labs yesterday that was performed approx wk ago and believes she is dehydrated; c/o dizziness and weakness

## 2024-02-27 NOTE — ED Provider Notes (Signed)
 [facility] Provider Note    Event Date/Time   First MD Initiated Contact with Patient 02/27/24 0309     (approximate)   History   No chief complaint on file.   HPI  Denise Schaefer is a 63 y.o. female with history of hypertension, coronary disease who presents to the emergency department complaints of feeling weak and lightheaded.  She reports Saturday, April 19 she had a syncopal event.  She believes that she is on too much blood pressure medication.  She is on hydrochlorothiazide 25 mg daily, spironolactone 25 mg daily and hydralazine 25 mg 3 times daily.  She has taken herself off the spironolactone and hydralazine because she states her blood pressures have been intermittently low.  She reports feeling very fatigued.  No chest pain, shortness of breath.  No vomiting or diarrhea.  No bloody stools or melena.  She had routine blood work with her PCP 02/21/2024 and had mildly elevated creatinine.  She states she was concerned she was dehydrated and decided to come to the emergency department.  Patient has not tolerated amlodipine, valsartan or carvedilol in the past.  She was given a prescription for meclizine for vertigo.  She denies any vertiginous symptoms.  No improvement with meclizine.   History provided by patient.    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 Authorizing Provider  azelastine (ASTELIN) 0.1 % nasal spray Place 1 spray into both  nostrils as needed. Use in each nostril as directed     [provider]  Biotin 2500 MCG CAPS Take by mouth.    [provider]  Dexlansoprazole 30 MG capsule DR Take by mouth.    [provider]  famotidine (PEPCID) 20 MG tablet Take 20 mg by mouth 2 (two) times daily.    [provider]  fluticasone (FLONASE) 50 MCG/ACT nasal spray Place into the nose.    [provider]  folic acid (FOLVITE) 1 MG tablet  04/08/17   [provider]  hydrocortisone (CORTENEMA) 100 MG/60ML enema  04/07/17   [provider]  hydrocortisone (PROCTOZONE-HC) 2.5 % rectal cream APPLY RECATLLY TWICE DAILY AS NEEDED 10/27/16   [provider]  levocetirizine (XYZAL) 5 MG tablet Take 5 mg by mouth every evening.    [provider]  losartan  (COZAAR ) 25 MG tablet Take 1 tablet (25 mg total) by mouth daily. 11/19/23 11/18/24  Stafford Eagles, PA-C  mesalamine (ROWASA) 4 g enema Place rectally. 09/05/22   [provider]  polyethylene glycol (MIRALAX / GLYCOLAX) packet Take 17 g by mouth daily as needed.     [provider]  Vitamin E (VITAMIN E/D-ALPHA NATURAL) 268 MG (400 UNIT) CAPS Take by mouth.    [provider]    Physical Exam   Triage Vital Signs: ED Triage Vitals  Encounter Vitals Group  BP 02/27/24 0300 (!) 156/95     Systolic BP Percentile --      Diastolic BP Percentile --      Pulse Rate 02/27/24 0300 77     Resp 02/27/24 0300 18     Temp 02/27/24 0300 97.8 F (36.6 C)     Temp Source 02/27/24 0300 Oral     SpO2 02/27/24 0300 100 %     Weight 02/27/24 0303 190 lb (86.2 kg)     Height 02/27/24 0303 5\' 7"  (1.702 m)     Head Circumference --      Peak Flow --      Pain Score 02/27/24 0303 0     Pain Loc --      Pain Education --      Exclude from Growth Chart --     Most recent vital signs: Vitals:   02/27/24 0500 02/27/24 0530  BP: 122/84 118/83  Pulse: 75 75  Resp: 16 14  Temp:     SpO2: 100% 100%    CONSTITUTIONAL: Alert, responds appropriately to questions. Well-appearing; well-nourished HEAD: Normocephalic, atraumatic EYES: Conjunctivae clear, pupils appear equal, sclera nonicteric ENT: normal nose; moist mucous membranes NECK: Supple, normal ROM CARD: RRR; S1 and S2 appreciated RESP: Normal chest excursion without splinting or tachypnea; breath sounds clear and equal bilaterally; no wheezes, no rhonchi, no rales, no hypoxia or respiratory distress, speaking full sentences ABD/GI: Non-distended; soft, non-tender, no rebound, no guarding, no peritoneal signs BACK: The back appears normal EXT: Normal ROM in all joints; no deformity noted, no edema, no calf tenderness or calf swelling SKIN: Normal color for age and race; warm; no rash on exposed skin NEURO: Moves all extremities equally, normal speech, no facial asymmetry PSYCH: The patient's mood and manner are appropriate.   ED Results / Procedures / Treatments   LABS: (all labs ordered are listed, but only abnormal results are displayed) Labs Reviewed  URINALYSIS, ROUTINE W REFLEX MICROSCOPIC - Abnormal; Notable for the following components:      Result Value   Color, Urine STRAW (*)    APPearance CLEAR (*)    All other components within normal limits  BASIC METABOLIC PANEL WITH GFR - Abnormal; Notable for the following components:   Potassium 3.3 (*)    Glucose, Bld 104 (*)    All other components within normal limits  CBC  MAGNESIUM  TROPONIN I (HIGH SENSITIVITY)     EKG:  EKG Interpretation Date/Time:  Tuesday February 27 2024 03:10:15 EDT Ventricular Rate:  68 PR Interval:  146 QRS Duration:  105 QT Interval:  438 QTC Calculation: 466 R Axis:   11  Text Interpretation: Sinus rhythm Probable left atrial enlargement RSR' in V1 or V2, right VCD or RVH Confirmed by Verneda Golder [phone]) on 02/27/2024 3:14:44 AM         RADIOLOGY: My personal review and interpretation of imaging:    I  have personally reviewed all radiology reports.   No results found.   PROCEDURES:  Critical Care performed: No    .1-3 Lead EKG Interpretation  Performed by: Manjit Bufano, Clover Dao, DO Authorized by: Vernita Tague, Clover Dao, DO     Interpretation: normal     ECG rate:  77   ECG rate assessment: normal     Rhythm: sinus rhythm     Ectopy: none     Conduction: normal       IMPRESSION / MDM / ASSESSMENT AND PLAN / ED COURSE  I reviewed  the triage vital signs and the nursing notes.    Patient here generalized weakness, lightheadedness, syncope over a week ago.  The patient is on the cardiac monitor to evaluate for evidence of arrhythmia and/or significant heart rate changes.   DIFFERENTIAL DIAGNOSIS (includes but not limited to):   Anemia, electrolyte derangement, dehydration, UTI, ACS, arrhythmia, less likely PE, stroke, dissection   Patient's presentation is most consistent with acute presentation with potential threat to life or bodily function.   PLAN: Will repeat labs today.  Will obtain troponin.  EKG nonischemic without arrhythmia.  Patient on cardiac monitoring.  Will give IV fluids.  Currently blood pressure in the 140s/80s just on HCTZ 25 mg daily.  She has stopped hydralazine based on pharmacy recommendations on 02/20/2024 and has taken herself off of spironolactone as well.  She states that she would like a referral for a new primary care provider.   MEDICATIONS GIVEN IN ED: Medications  sodium chloride  0.9 % bolus 1,000 mL (0 mLs Intravenous Stopped 02/27/24 0405)  potassium chloride  SA (KLOR-CON  M) CR tablet 40 mEq (40 mEq Oral Given 02/27/24 0401)     ED COURSE: Orthostats negative but patient does report feeling lightheaded with changes in position.  Labs show normal hemoglobin.  Creatinine of 0.95.  Potassium of 3.3.  Will give oral replacement.  Magnesium level normal.  Troponin negative.  Patient reports feeling significantly improved after a liter of fluids.   Blood pressure currently 118/83.  Have advised her to follow-up with her primary care provider for further management of her labile blood pressures.  I did place a referral for a new PCP per her request.  Recommended increase fluid intake.  At this time, I do not feel there is any life-threatening condition present. I reviewed all nursing notes, vitals, pertinent previous records.  All lab and urine results, EKGs, imaging ordered have been independently reviewed and interpreted by myself.  I reviewed all available radiology reports from any imaging ordered this visit.  Based on my assessment, I feel the patient is safe to be discharged home without further emergent workup and can continue workup as an outpatient as needed. Discussed all findings, treatment plan as well as usual and customary return precautions.  They verbalize understanding and are comfortable with this plan.  Outpatient follow-up has been provided as needed.  All questions have been answered.   CONSULTS: Admission considered but given workup is reassuring, syncopal event occurred over a week ago, patient hemodynamically stable, I feel she has safe for outpatient follow-up.   OUTSIDE RECORDS REVIEWED: Reviewed recent PCP notes.  During last visit on 02/05/2024 patient's blood pressure was noted to be 165/119.       FINAL CLINICAL IMPRESSION(S) / ED DIAGNOSES   Final diagnoses:  Generalized weakness  Labile blood pressure  Lightheadedness  Syncope, unspecified syncope type     Rx / DC Orders   ED Discharge Orders          Ordered    Ambulatory Referral to Primary Care (Establish Care)        02/27/24 0352             Note:  This document was prepared using Dragon voice recognition software and may include unintentional dictation errors.   Devin Ganaway, Clover Dao, DO 02/27/24 [phone]

## 2024-02-28 ENCOUNTER — Other Ambulatory Visit: Payer: Self-pay

## 2024-02-28 ENCOUNTER — Emergency Department
Admission: EM | Admit: 2024-02-28 | Discharge: 2024-02-28 | Disposition: A | Discharge status: Home / Self Care | Attending: Emergency Medicine | Admitting: Emergency Medicine

## 2024-02-28 ENCOUNTER — Emergency Department

## 2024-02-28 DIAGNOSIS — E041 Nontoxic single thyroid nodule: Secondary | ICD-10-CM | POA: Insufficient documentation

## 2024-02-28 DIAGNOSIS — E860 Dehydration: Secondary | ICD-10-CM | POA: Insufficient documentation

## 2024-02-28 DIAGNOSIS — R42 Dizziness and giddiness: Secondary | ICD-10-CM

## 2024-02-28 DIAGNOSIS — I10 Essential (primary) hypertension: Secondary | ICD-10-CM | POA: Insufficient documentation

## 2024-02-28 DIAGNOSIS — R531 Weakness: Secondary | ICD-10-CM | POA: Diagnosis present

## 2024-02-28 LAB — CBC WITH DIFFERENTIAL/PLATELET
Abs Immature Granulocytes: 0.02 10*3/uL (ref 0.00–0.07)
Basophils Absolute: 0 10*3/uL (ref 0.0–0.1)
Basophils Relative: 1 %
Eosinophils Absolute: 0.1 10*3/uL (ref 0.0–0.5)
Eosinophils Relative: 1 %
HCT: 39.7 % (ref 36.0–46.0)
Hemoglobin: 12.6 g/dL (ref 12.0–15.0)
Immature Granulocytes: 0 %
Lymphocytes Relative: 26 %
Lymphs Abs: 1.6 10*3/uL (ref 0.7–4.0)
MCH: 26.4 pg (ref 26.0–34.0)
MCHC: 31.7 g/dL (ref 30.0–36.0)
MCV: 83.1 fL (ref 80.0–100.0)
Monocytes Absolute: 0.5 10*3/uL (ref 0.1–1.0)
Monocytes Relative: 8 %
Neutro Abs: 4 10*3/uL (ref 1.7–7.7)
Neutrophils Relative %: 64 %
Platelets: 292 10*3/uL (ref 150–400)
RBC: 4.78 MIL/uL (ref 3.87–5.11)
RDW: 14 % (ref 11.5–15.5)
WBC: 6.2 10*3/uL (ref 4.0–10.5)
nRBC: 0 % (ref 0.0–0.2)

## 2024-02-28 LAB — COMPREHENSIVE METABOLIC PANEL WITH GFR
ALT: 12 U/L (ref 0–44)
AST: 24 U/L (ref 15–41)
Albumin: 3.9 g/dL (ref 3.5–5.0)
Alkaline Phosphatase: 81 U/L (ref 38–126)
Anion gap: 10 (ref 5–15)
BUN: 17 mg/dL (ref 8–23)
CO2: 23 mmol/L (ref 22–32)
Calcium: 9.2 mg/dL (ref 8.9–10.3)
Chloride: 102 mmol/L (ref 98–111)
Creatinine, Ser: 0.9 mg/dL (ref 0.44–1.00)
GFR, Estimated: >60 mL/min (ref >60)
Glucose, Bld: 92 mg/dL (ref 70–99)
Potassium: 3.8 mmol/L (ref 3.5–5.1)
Sodium: 135 mmol/L (ref 135–145)
Total Bilirubin: 0.6 mg/dL (ref 0.0–1.2)
Total Protein: 8.6 g/dL — ABNORMAL HIGH (ref 6.5–8.1)

## 2024-02-28 LAB — URINALYSIS, W/ REFLEX TO CULTURE (INFECTION SUSPECTED)
Bacteria, UA: NONE SEEN
Bilirubin Urine: NEGATIVE
Glucose, UA: NEGATIVE mg/dL
Hgb urine dipstick: NEGATIVE
Ketones, ur: NEGATIVE mg/dL
Leukocytes,Ua: NEGATIVE
Nitrite: NEGATIVE
Protein, ur: NEGATIVE mg/dL
RBC / HPF: 0 RBC/hpf (ref 0–5)
Specific Gravity, Urine: 1.006 (ref 1.005–1.030)
pH: 6 (ref 5.0–8.0)

## 2024-02-28 LAB — TROPONIN I (HIGH SENSITIVITY): Troponin I (High Sensitivity): 3 ng/L (ref <18)

## 2024-02-28 LAB — TSH: TSH: 1.082 u[IU]/mL (ref 0.350–4.500)

## 2024-02-28 MED ORDER — METOCLOPRAMIDE HCL 5 MG/ML IJ SOLN
10.0000 mg | Freq: Once | INTRAMUSCULAR | Status: AC
  Administered 2024-02-28: 10 mg via INTRAVENOUS
  Filled 2024-02-28: qty 2

## 2024-02-28 MED ORDER — LACTATED RINGERS IV BOLUS
1000.0000 mL | Freq: Once | INTRAVENOUS | Status: AC
Start: 2024-02-28 — End: 2024-02-28
  Administered 2024-02-28: 1000 mL via INTRAVENOUS

## 2024-02-28 NOTE — ED Provider Notes (Signed)
 [facility] Provider Note    Event Date/Time   First MD Initiated Contact with Patient 02/28/24 1541     (approximate)   History   Weakness and Dizziness   HPI  Denise Schaefer is a 63 y.o. female with history of GERD, Crohn's disease, arthritis, anxiety, hypertension, presenting with generalized weakness as well as intermittent lightheadedness.  Patient states that symptoms have been ongoing for a month, states that she is being referred to rheumatology.  States that she was seen yesterday for weakness and lightheadedness, was concerned that she was dehydrated.  Was given fluids, had labs obtained and cleared for discharge with new primary care follow-up.  She presents today because she was at the store, felt lightheaded again, felt quite weak, went home and had persistent symptoms and so presented here.  States that she has a right-sided headache as well but no vision changes, no dizziness at this time, no focal weakness or numbness.  No chest pain or shortness of breath, no urinary symptoms, no nausea, vomiting, diarrhea, fevers.  She denies any trauma or falls.  No incontinence.    On independent review, she was seen by primary care doctor on April 22, history of hypertension, had previous intolerance to valsartan, will switch to amlodipine with similar reactions, could not tolerate carvedilol due to fatigue.  She was seen in the emergency department yesterday night, a new referral for PCP was placed.  She had stopped hydralazine based on her pharmacy's recommendation and is well has stopped her spironolactone.  Physical Exam   Triage Vital Signs: ED Triage Vitals  Encounter Vitals Group     BP 02/28/24 1329 (!) 109/91     Systolic BP Percentile --      Diastolic BP Percentile --      Pulse Rate 02/28/24 1329 70     Resp 02/28/24 1329 16     Temp 02/28/24 1329 97.8 F (36.6 C)     Temp Source 02/28/24 1329 Oral     SpO2 02/28/24 1329 99 %     Weight  02/28/24 1337 190 lb (86.2 kg)     Height 02/28/24 1337 5\' 7"  (1.702 m)     Head Circumference --      Peak Flow --      Pain Score 02/28/24 1336 0     Pain Loc --      Pain Education --      Exclude from Growth Chart --     Most recent vital signs: Vitals:   02/28/24 1329 02/28/24 1738  BP: (!) 109/91 (!) 146/89  Pulse: 70 63  Resp: 16 18  Temp: 97.8 F (36.6 C)   SpO2: 99% 100%     General: Awake, no distress.  CV:  Good peripheral perfusion.  Resp:  Normal effort.  Abd:  No distention.  Soft nontender Other:  No lower extremity edema, pupils are equal and reactive, extraocular movements are intact, she has no cranial nerve deficits, no focal weakness or numbness.   ED Results / Procedures / Treatments   Labs (all labs ordered are listed, but only abnormal results are displayed) Labs Reviewed  COMPREHENSIVE METABOLIC PANEL WITH GFR - Abnormal; Notable for the following components:      Result Value   Total Protein 8.6 (*)    All other components within normal limits  URINALYSIS, W/ REFLEX TO CULTURE (INFECTION SUSPECTED) - Abnormal; Notable for the following components:   Color, Urine STRAW (*)  APPearance CLEAR (*)    All other components within normal limits  CBC WITH DIFFERENTIAL/PLATELET  TSH  TROPONIN I (HIGH SENSITIVITY)  TROPONIN I (HIGH SENSITIVITY)     EKG  Sinus rhythm, rate of 61, normal QRS, normal QTc, T wave flattening in 3, V3, aVF, no ischemic ST elevation, not significant change compared to prior   RADIOLOGY CT on my independent interpretation without obvious intracranial hemorrhage   PROCEDURES:  Critical Care performed: No  Procedures   MEDICATIONS ORDERED IN ED: Medications  lactated ringers bolus 1,000 mL (1,000 mLs Intravenous New Bag/Given 02/28/24 1610)  metoCLOPramide (REGLAN) injection 10 mg (10 mg Intravenous Given 02/28/24 1729)     IMPRESSION / MDM / ASSESSMENT AND PLAN / ED COURSE  I reviewed the triage vital  signs and the nursing notes.                              Differential diagnosis includes, but is not limited to, dehydration, electrolyte derangements, UTI, atypical ACS, arrhythmia.  Will get labs, EKG, troponin, chest x-ray, IV fluids, for her headache, considered migraine, tension headache, she has no focal deficits at this time to suggest CVA or intracranial hemorrhage.  Will give her some Reglan for her migraine and reassess.  Shared decision making done with patient and she is agreeable with the plan.  Patient's presentation is most consistent with acute presentation with potential threat to life or bodily function.  Independent review of labs imaging below.  On reassessment patient is feeling a lot better, she is well-appearing, discussed with patient about labs as well as imaging findings including incidental findings of thyroid nodule.  Discussed with patient about following up with her primary care doctor outpatient for further management of her symptoms as well as the incidental findings.  Encouraged hydration, considered but no indication for inpatient admission at this time, she is safe for outpatient management.  Shared decision making and patient is agreeable with the plan.  Will discharge with strict return precautions.   Clinical Course as of 02/28/24 1814  Wed Feb 28, 2024  1659 CT Head Wo Contrast 1. No acute intracranial process.  [TT]  1659 CT Cervical Spine Wo Contrast IMPRESSION: 1. No acute cervical spine fracture. 2. Unremarkable ACDF at the C3-4 level. 3. 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 Mild spondylosis at C4-5 and C5-6. 4. Incidental right thyroid nodule measuring 1.9 cm. Recommend non-emergent thyroid ultrasound. Reference: J Am Coll Radiol. 2015 Feb;12(2): 143-50   [TT]  1714 Independent review of labs, troponin elevated, electrolytes not severely deranged, no leukocytosis, UA is negative. [TT]  1806 TSH: 1.082 Normal  [TT]    Clinical Course User Index [TT] Drenda Gentle Richard Champion, MD     FINAL CLINICAL IMPRESSION(S) / ED DIAGNOSES   Final diagnoses:  Dehydration  Generalized weakness  Lightheadedness  Thyroid nodule     Rx / DC Orders   ED Discharge Orders     None        Note:  This document was prepared using Dragon voice recognition software and may include unintentional dictation errors.    Shane Darling, MD 02/28/24 [phone]

## 2024-02-28 NOTE — ED Triage Notes (Addendum)
 Pt seen yesterday for concerns of dehydration and feeling weak/fatigued. Pt lab results unremarkable except K 3.3. Pt given liter of fluids and oral K replacement and discharged. Pt reports she still feels generally light headed and c/o tinnitus. Pt keeps saying she feels like something is wrong. Pt keeps referencing "hardware in my neck."

## 2024-02-28 NOTE — Discharge Instructions (Signed)
 Please make sure to follow-up with your primary care doctor for further management of symptoms including incidental finding on your CT of the thyroid nodule. Please make sure to keep yourself hydrated.
